# Patient Record
Sex: Female | Born: 1945
Health system: Southern US, Community
[De-identification: ages and names within clinical notes are randomized; demographics above are authoritative.]

## PROBLEM LIST (undated history)

## (undated) DIAGNOSIS — K635 Polyp of colon: Secondary | ICD-10-CM

## (undated) DIAGNOSIS — I10 Essential (primary) hypertension: Secondary | ICD-10-CM

## (undated) HISTORY — DX: Polyp of colon: K63.5

## (undated) HISTORY — PX: BUNIONECTOMY: SHX129

## (undated) HISTORY — DX: Essential (primary) hypertension: I10

---

## 1980-07-26 HISTORY — PX: ABDOMINAL HYSTERECTOMY: SHX81

## 1997-11-15 ENCOUNTER — Ambulatory Visit (HOSPITAL_COMMUNITY): Admission: RE | Admit: 1997-11-15 | Discharge: 1997-11-15 | Payer: Self-pay | Admitting: Internal Medicine

## 1997-11-20 ENCOUNTER — Ambulatory Visit (HOSPITAL_COMMUNITY): Admission: RE | Admit: 1997-11-20 | Discharge: 1997-11-20 | Payer: Self-pay | Admitting: Internal Medicine

## 2001-03-11 ENCOUNTER — Emergency Department (HOSPITAL_COMMUNITY): Admission: EM | Admit: 2001-03-11 | Discharge: 2001-03-11 | Payer: Self-pay | Admitting: *Deleted

## 2001-03-11 ENCOUNTER — Encounter: Payer: Self-pay | Admitting: *Deleted

## 2001-03-18 ENCOUNTER — Encounter: Payer: Self-pay | Admitting: Neurosurgery

## 2001-03-18 ENCOUNTER — Ambulatory Visit (HOSPITAL_COMMUNITY): Admission: RE | Admit: 2001-03-18 | Discharge: 2001-03-18 | Payer: Self-pay | Admitting: Neurosurgery

## 2002-05-14 ENCOUNTER — Other Ambulatory Visit: Admission: RE | Admit: 2002-05-14 | Discharge: 2002-05-14 | Payer: Self-pay | Admitting: Obstetrics and Gynecology

## 2002-06-06 ENCOUNTER — Encounter: Admission: RE | Admit: 2002-06-06 | Discharge: 2002-06-06 | Payer: Self-pay | Admitting: Internal Medicine

## 2002-06-06 ENCOUNTER — Encounter: Payer: Self-pay | Admitting: Internal Medicine

## 2002-06-15 ENCOUNTER — Encounter: Payer: Self-pay | Admitting: Internal Medicine

## 2002-06-15 ENCOUNTER — Encounter: Admission: RE | Admit: 2002-06-15 | Discharge: 2002-06-15 | Payer: Self-pay | Admitting: Internal Medicine

## 2003-05-17 ENCOUNTER — Other Ambulatory Visit: Admission: RE | Admit: 2003-05-17 | Discharge: 2003-05-17 | Payer: Self-pay | Admitting: Obstetrics and Gynecology

## 2003-05-22 ENCOUNTER — Encounter: Admission: RE | Admit: 2003-05-22 | Discharge: 2003-05-22 | Payer: Self-pay | Admitting: Internal Medicine

## 2003-05-31 ENCOUNTER — Ambulatory Visit (HOSPITAL_COMMUNITY): Admission: RE | Admit: 2003-05-31 | Discharge: 2003-05-31 | Payer: Self-pay | Admitting: Internal Medicine

## 2005-09-09 ENCOUNTER — Emergency Department (HOSPITAL_COMMUNITY): Admission: EM | Admit: 2005-09-09 | Discharge: 2005-09-09 | Payer: Self-pay | Admitting: Emergency Medicine

## 2007-12-25 ENCOUNTER — Inpatient Hospital Stay (HOSPITAL_COMMUNITY): Admission: EM | Admit: 2007-12-25 | Discharge: 2007-12-26 | Payer: Self-pay | Admitting: Emergency Medicine

## 2007-12-25 ENCOUNTER — Ambulatory Visit: Payer: Self-pay | Admitting: Internal Medicine

## 2007-12-26 ENCOUNTER — Encounter: Payer: Self-pay | Admitting: Infectious Disease

## 2007-12-26 ENCOUNTER — Ambulatory Visit: Payer: Self-pay | Admitting: Internal Medicine

## 2007-12-26 ENCOUNTER — Ambulatory Visit: Payer: Self-pay | Admitting: Vascular Surgery

## 2007-12-29 ENCOUNTER — Ambulatory Visit: Payer: Self-pay

## 2008-01-04 ENCOUNTER — Ambulatory Visit: Payer: Self-pay | Admitting: Internal Medicine

## 2008-01-04 DIAGNOSIS — E785 Hyperlipidemia, unspecified: Secondary | ICD-10-CM | POA: Insufficient documentation

## 2008-01-04 DIAGNOSIS — I10 Essential (primary) hypertension: Secondary | ICD-10-CM | POA: Insufficient documentation

## 2008-01-04 DIAGNOSIS — B029 Zoster without complications: Secondary | ICD-10-CM | POA: Insufficient documentation

## 2008-01-11 ENCOUNTER — Ambulatory Visit: Payer: Self-pay

## 2008-01-11 DIAGNOSIS — R079 Chest pain, unspecified: Secondary | ICD-10-CM | POA: Insufficient documentation

## 2008-02-10 ENCOUNTER — Emergency Department (HOSPITAL_COMMUNITY): Admission: EM | Admit: 2008-02-10 | Discharge: 2008-02-10 | Payer: Self-pay | Admitting: Emergency Medicine

## 2008-02-12 ENCOUNTER — Encounter: Payer: Self-pay | Admitting: Internal Medicine

## 2008-02-14 ENCOUNTER — Emergency Department (HOSPITAL_COMMUNITY): Admission: EM | Admit: 2008-02-14 | Discharge: 2008-02-14 | Payer: Self-pay | Admitting: Emergency Medicine

## 2008-02-22 ENCOUNTER — Ambulatory Visit: Payer: Self-pay | Admitting: Internal Medicine

## 2008-02-22 ENCOUNTER — Encounter: Payer: Self-pay | Admitting: Internal Medicine

## 2008-02-23 DIAGNOSIS — M79609 Pain in unspecified limb: Secondary | ICD-10-CM | POA: Insufficient documentation

## 2008-06-13 ENCOUNTER — Encounter: Admission: RE | Admit: 2008-06-13 | Discharge: 2008-06-13 | Payer: Self-pay | Admitting: Internal Medicine

## 2008-07-30 IMAGING — CT CT PELVIS W/O CM
2 of 7 series · 15 of 46 positions shown, 19 images · non-contrast
Comparison: Abdominal CT 09/09/2005.

CT ABDOMEN

CLINICAL DATA: Right flank pain.  Question ureteral calculus.

CT ABDOMEN AND PELVIS WITHOUT CONTRAST
TECHNIQUE: Multidetector CT imaging of the abdomen and pelvis was
performed following the standard
protocol without intravenous contrast.

[Series 2: stone_wo 5.0 b40f st · axial · 0.76mm/px · z∈[-266,+154]mm · 12 of 117 slices shown, 16 images]
[im 6/117  soft-tissue]
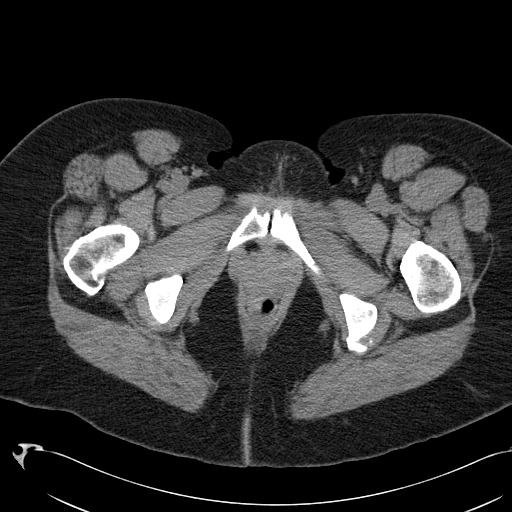
[im 6/117  bone]
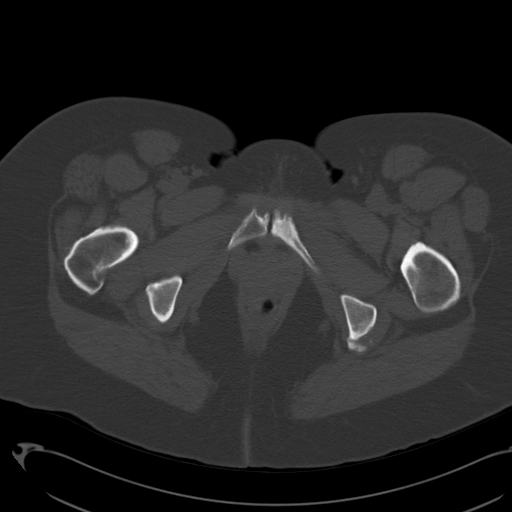
[im 18/117  soft-tissue]
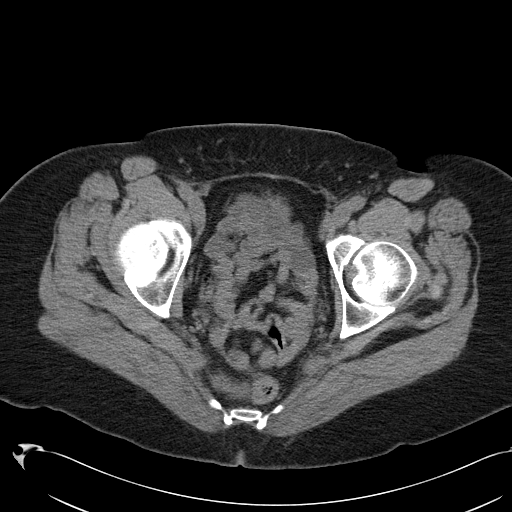
[im 30/117  soft-tissue]
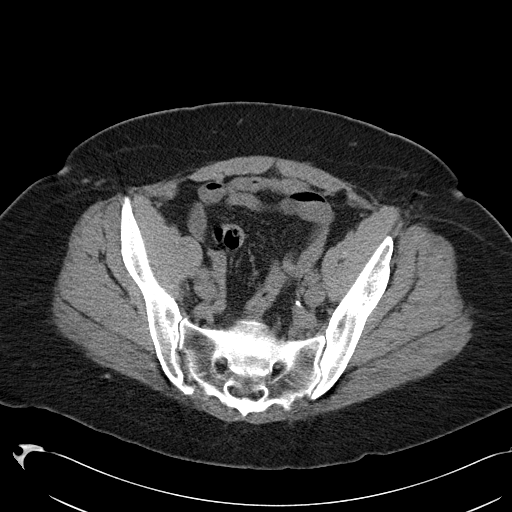
[im 41/117  soft-tissue]
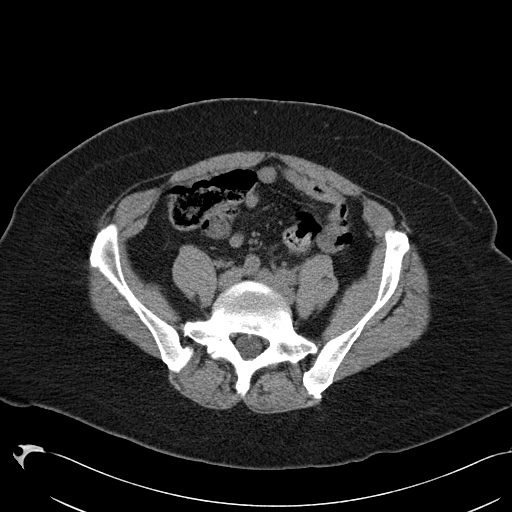
[im 53/117  soft-tissue]
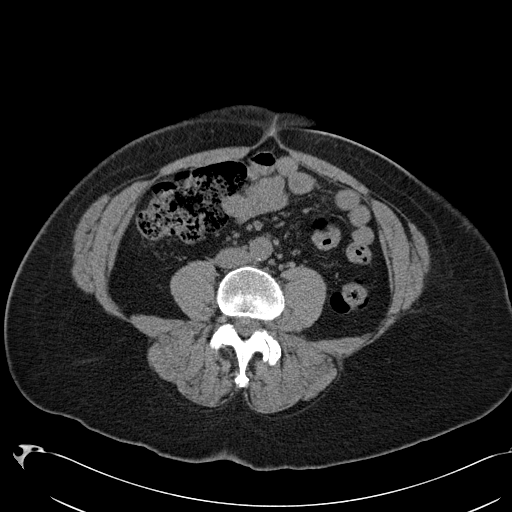
[im 64/117  soft-tissue]
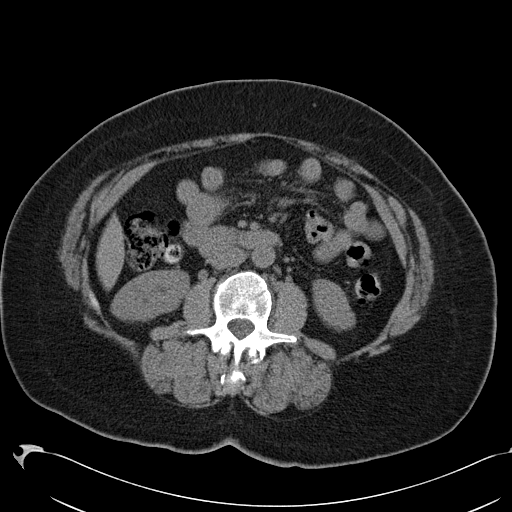
[im 76/117  soft-tissue]
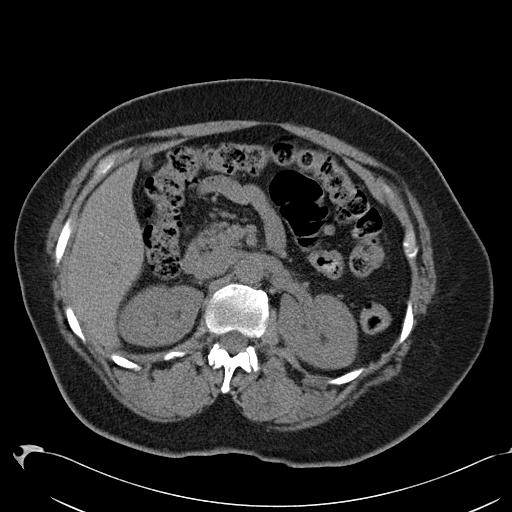
[im 88/117  soft-tissue]
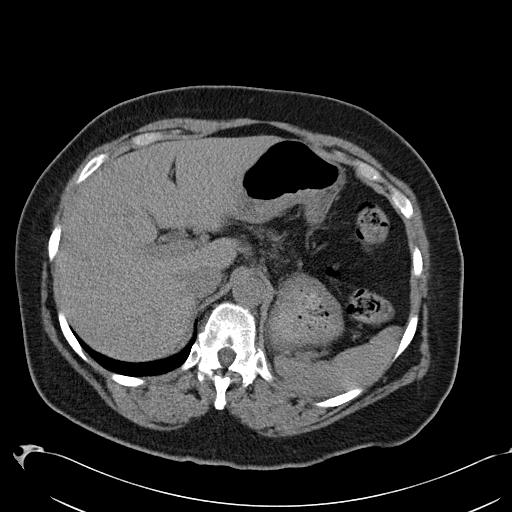
[im 93/117  lung]
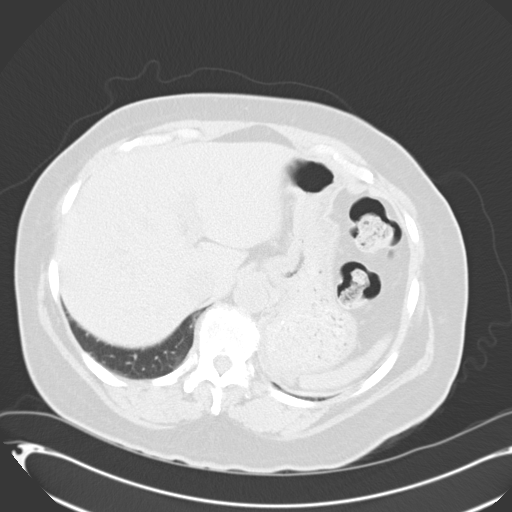
[im 99/117  soft-tissue]
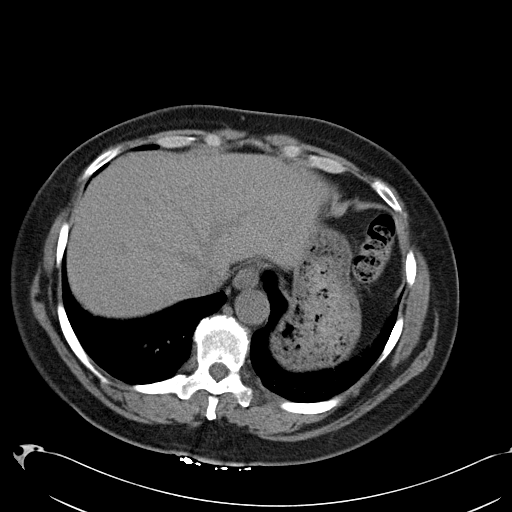
[im 99/117  lung]
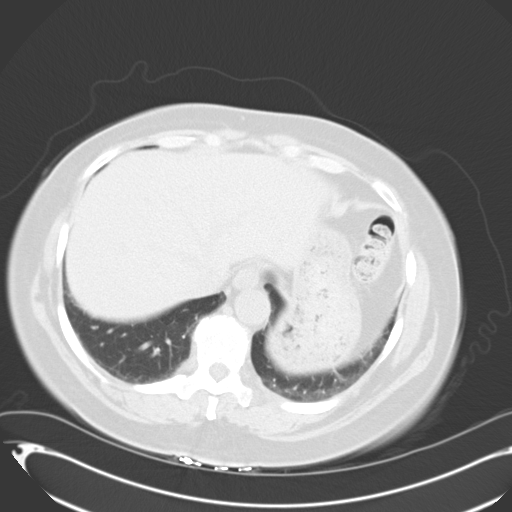
[im 99/117  bone]
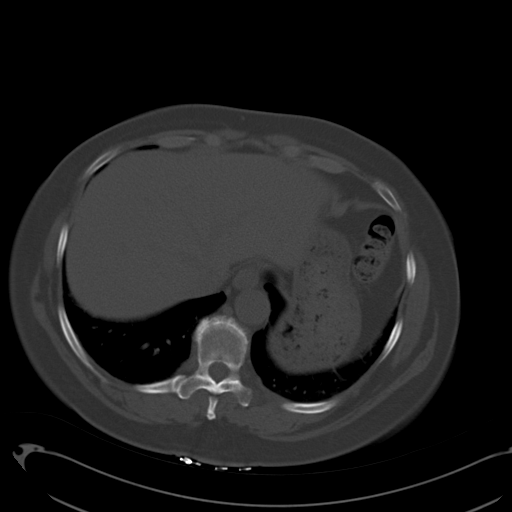
[im 105/117  lung]
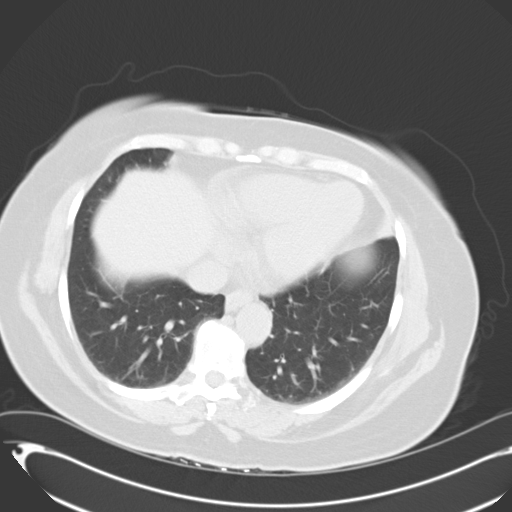
[im 111/117  soft-tissue]
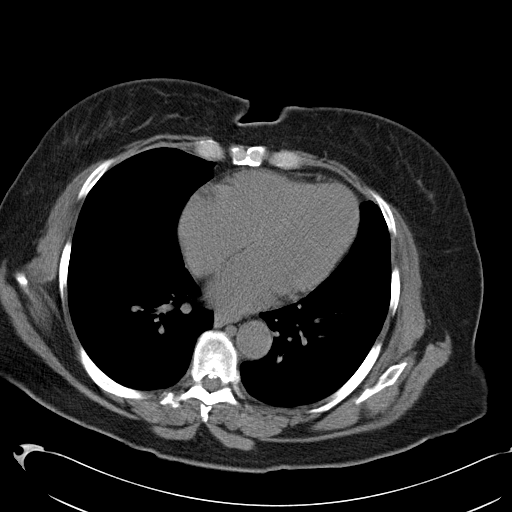
[im 111/117  lung]
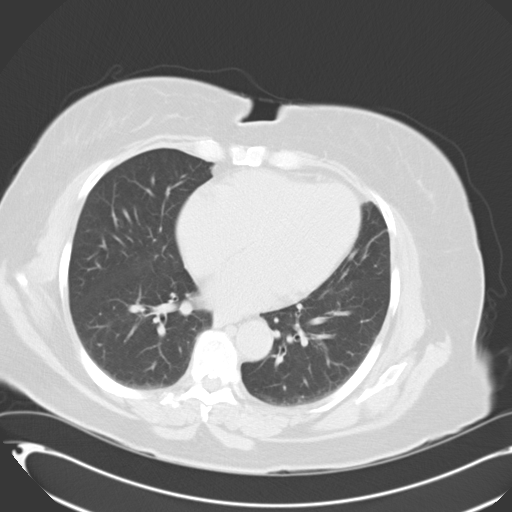

[Series 602: coronal · coronal · 0.95mm/px · 3 of 80 slices shown]
[im 16/80  soft-tissue]
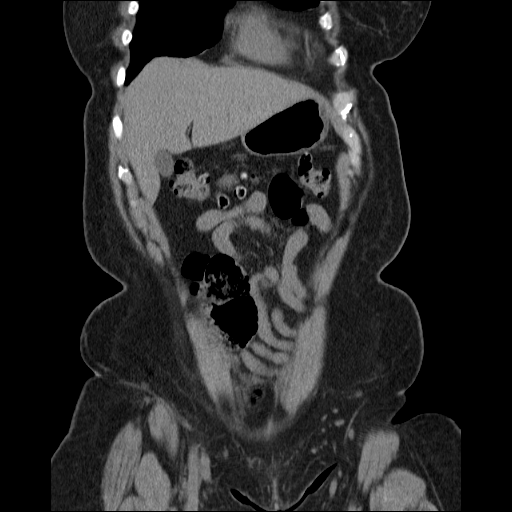
[im 32/80  soft-tissue]
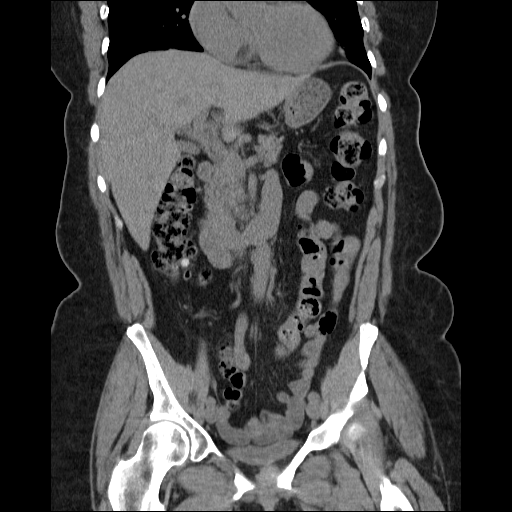
[im 48/80  soft-tissue]
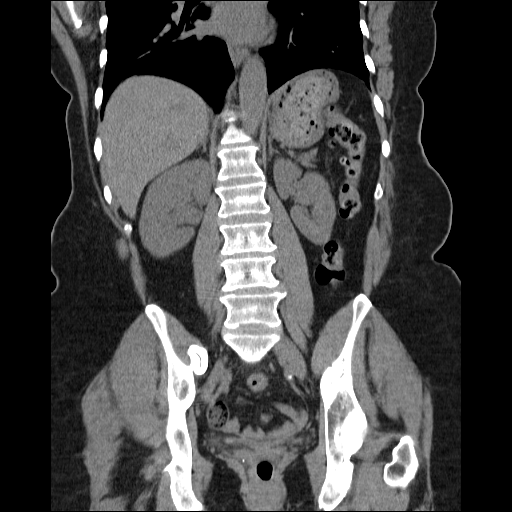

[15 of 46 positions shown; findings below may reference images not displayed]

FINDINGS: The lung bases are clear.  There is no pleural effusion.
There is a 2.8 cm cyst posteriorly in the right kidney on image 49
which is not significantly changed from the prior examination.  No
other renal masses are demonstrated.  There is no evidence of renal
or ureteral calculus.  There is no hydronephrosis or perinephric
soft tissue stranding.  A phlebolith anterior to the left psoas
muscle on image 72 is lateral to the ureter and unchanged from the
prior study.

As imaged in the noncontrast state, the liver, spleen, adrenal
glands, pancreas and gallbladder appear normal.  Diffuse colonic
diverticular changes are present without surrounding inflammatory
change.
IMPRESSION: 1.  No evidence of urinary tract calculus or hydronephrosis.
2.  Stable right renal cyst.
3.  No acute abdominal findings.

CT PELVIS
FINDINGS: Distally, the ureters are normal in caliber.  There is
no evidence of ureteral calculus.  No pelvic mass, fluid collection
or inflammatory process is seen.  Pelvic phleboliths appear stable.
IMPRESSION: 1.  Negative for urinary tract calculus or hydronephrosis.
2.  No acute pelvic findings demonstrated.

## 2008-08-20 ENCOUNTER — Ambulatory Visit: Payer: Self-pay | Admitting: Infectious Disease

## 2008-08-20 ENCOUNTER — Encounter: Payer: Self-pay | Admitting: Internal Medicine

## 2008-08-20 ENCOUNTER — Ambulatory Visit (HOSPITAL_COMMUNITY): Admission: RE | Admit: 2008-08-20 | Discharge: 2008-08-20 | Payer: Self-pay | Admitting: Internal Medicine

## 2008-08-20 DIAGNOSIS — M25519 Pain in unspecified shoulder: Secondary | ICD-10-CM | POA: Insufficient documentation

## 2008-09-09 ENCOUNTER — Ambulatory Visit: Payer: Self-pay | Admitting: Internal Medicine

## 2008-09-09 ENCOUNTER — Ambulatory Visit (HOSPITAL_COMMUNITY): Admission: RE | Admit: 2008-09-09 | Discharge: 2008-09-09 | Payer: Self-pay | Admitting: Internal Medicine

## 2009-04-26 ENCOUNTER — Encounter: Admission: RE | Admit: 2009-04-26 | Discharge: 2009-04-26 | Payer: Self-pay | Admitting: Internal Medicine

## 2009-08-08 ENCOUNTER — Encounter: Admission: RE | Admit: 2009-08-08 | Discharge: 2009-08-08 | Payer: Self-pay | Admitting: Internal Medicine

## 2010-05-17 ENCOUNTER — Emergency Department (HOSPITAL_COMMUNITY): Admission: EM | Admit: 2010-05-17 | Discharge: 2010-05-17 | Payer: Self-pay | Admitting: Emergency Medicine

## 2010-08-17 ENCOUNTER — Encounter: Payer: Self-pay | Admitting: Internal Medicine

## 2010-08-18 ENCOUNTER — Encounter
Admission: RE | Admit: 2010-08-18 | Discharge: 2010-08-18 | Payer: Self-pay | Source: Home / Self Care | Attending: Internal Medicine | Admitting: Internal Medicine

## 2010-08-27 NOTE — Assessment & Plan Note (Signed)
Summary: back pain, falling, wants to get back to work/pcp-alvarez/hla   Vital Signs:  Patient Profile:   65 Years Old Female Height:     66.5 inches (168.91 cm) Weight:      231.5 pounds (105.23 kg) BMI:     36.94 Temp:     97.1 degrees F (36.17 degrees C) oral Pulse rate:   100 / minute BP sitting:   146 / 97  (right arm)  Pt. in pain?   yes    Location:   right hip to knee    Intensity:   9  Vitals Entered By: Stanton Kidney Ditzler RN (February 22, 2008 2:09 PM)              Is Patient Diabetic? No Nutritional Status BMI of > 30 = obese Nutritional Status Detail appetite good  Have you ever been in a relationship where you felt threatened, hurt or afraid?denies   Does patient need assistance? Functional Status Self care Ambulation Impaired:Risk for fall Comments Uses a cane.     PCP:  Mariea Stable MD  Chief Complaint:  Pain getting worse on right hip to knee. Refill on pain med.Marland Kitchen  History of Present Illness: 65 yo woman with PMH of HTN, hyperlipidemia, anemia, right lower extremity pain (negative Dopplers) came in today for right leg pain that started about 3 weeks ago. She was working and while bending forward she felt a "pop"  in the right lower back area. After that, she started to get episodes of sharp pain, starting in her right hip (on the side) radiating down to her knee and sometimes down to her foot. Pain is worse with walking, or pushing heavy objects (which she does at work), and it is relieved by rest, crossing right leg over the left leg, and bending forward. She was seen here in ED about a week ago and was given cyclobenzaprine and ibuprfen. It helped with the pain but she ran out of the medicines. She denies leg weakness, numbness, tinggling, no difficulty walking, no swelling, or erythema, no fever, chills, no bladder or bowel incontinence, no other traumas to her right extremity.    Prior Medication List:   METOPROLOL TARTRATE 25 MG  TABS (METOPROLOL TARTRATE) take 1/2 tablet two times a day SIMVASTATIN 40 MG  TABS (SIMVASTATIN) Take 1 tab by mouth at bedtime ASPIRIN ADULT LOW STRENGTH 81 MG  TBEC (ASPIRIN) Take 1 tablet by mouth once a day * BLOOD BLEND SP-11A (DANDELION-YELLOW DOCK) - SILICA, CALCIUM Take 2 tablets by mouth 3  times a day   Updated Prior Medication List: METOPROLOL TARTRATE 25 MG  TABS (METOPROLOL TARTRATE) take 1/2 tablet two times a day SIMVASTATIN 40 MG  TABS (SIMVASTATIN) Take 1 tab by mouth at bedtime ASPIRIN ADULT LOW STRENGTH 81 MG  TBEC (ASPIRIN) Take 1 tablet by mouth once a day * BLOOD BLEND SP-11A (DANDELION-YELLOW DOCK) - SILICA, CALCIUM Take 2 tablets by mouth 3  times a day IBUPROFEN 600 MG  TABS (IBUPROFEN) Take 1 tablet by mouth three times a day  Current Allergies (reviewed today): No known allergies   Past Medical History:    Reviewed history from 01/04/2008 and no changes required:        1. Chest pain ruled out for myocardial infarction with negative             cardiac enzymes and serial EKGs.         2. Anemia (mild).  3. Hypertension.         4. Headaches with negative ESR.         5. Right lower extremity pain with negative Dopplers.         6. Hyperlipidemia with an LDL of 223.   Family History:    Reviewed history and no changes required:       No significant medical family history  Social History:    Reviewed history from 01/04/2008 and no changes required:       Former Smoker   Risk Factors:  Tobacco use:  quit    Year quit:  years ago Passive smoke exposure:  no Drug use:  no HIV high-risk behavior:  no Alcohol use:  yes    Type:  beer, wine    Comments:  drinks occasionally   Review of Systems  The patient denies fever, peripheral edema, muscle weakness, difficulty walking, and unusual weight change.     Physical Exam  General:     alert and cooperative to examination.     Knee Exam  Knee Exam:     Right:    Inspection:  Normal    Palpation:  Normal    Stability:  stable    Tenderness:  no    Swelling:  no    Erythema:  no    Left:    Inspection:  Normal    Palpation:  Normal    Stability:  stable    Tenderness:  no    Swelling:  no    Erythema:  no   Hip Exam  Sensory:    Gross coordination and sensation were normal.  Motor:    Motor strength 5/5 bilaterally for quadriceps, hamstrings, ankle dorsiflexion, ankle plantar flexion, .  Hip Exam:    Right:    Inspection:  Normal    Palpation:  Normal    Stability:  stable    Tenderness:  greater trochater    Swelling:  no    Erythema:  no    point tenderness over the greates trochater, radiating lateraly down her thigh to the knee    Left:    Inspection:  Normal    Palpation:  Normal    Stability:  stable    Tenderness:  no    Swelling:  no    Erythema:  no   Detailed Back/Spine Exam  Inspection:    No deformity, ecchymosis or swelling.   Lumbosacral Exam:  Inspection-deformity:    Normal Palpation-spinal tenderness:  Abnormal    Location:  L5-S1    point tenderness in lower lumbar region Range of Motion:    Right Lateral Bend:   20 degrees    Left Lateral Bend:   20 degrees Lying Straight Leg Raise:    Right:  positive at 40 degrees    Left:  negative Sitting Straight Leg Raise:    Right:  negative    Left:  negative Sciatic Notch:    There is no sciatic notch tenderness.    Impression & Recommendations:  Problem # 1:  LEG PAIN, RIGHT (ICD-729.5) Patients symptoms are likely due to herniated disk, probably job related injury. She had x-ray of her hip and knee done last week when she was in ED and they both showed no acute abnormalities, some degenerative spurring along upper right SI joint and slightly decreased L5-S1 disc space. Patient will continue taking:  Ibuprofen 600 mg three times per day  Cyclobenzaprine 5 mg three times per day as  needed for pain   Lidocaine 5% patch (12 hours on and 12 hours off)  I also wrote a note for the patient to take to her work so that they can assign her some light weight work while she is in pain. I will see her back in 64 month for follow up.  Problem # 2:  HYPERLIPIDEMIA (ICD-272.4) I was not able to find the the recent records of her FLP, so we will check it on her next appointment, we will also get her c-met checked since she has been on Simvastatin. She reports taking this med regularly.  Her updated medication list for this problem includes:    Simvastatin 40 Mg Tabs (Simvastatin) .Marland Kitchen... Take 1 tab by mouth at bedtime  Future Orders: T-Lipid Profile ([id number]) ... 03/22/2008   Problem # 3:  ESSENTIAL HYPERTENSION, BENIGN (ICD-401.1) BP is slightly above target range. Patient has missed few doses in the past 3 days. We will not make any changes to her regimen today but will continue to follow up. We will se her back in 1 month. I also advised her to check her BP at the drug store and to write it down so that we can review it on her next visit.  Her updated medication list for this problem includes:    Metoprolol Tartrate 25 Mg Tabs (Metoprolol tartrate) .Marland Kitchen... Take 1/2 tablet two times a day  BP today: 146/97 Prior BP: 126/82 (01/11/2008)   Complete Medication List: 1)  Metoprolol Tartrate 25 Mg Tabs (Metoprolol tartrate) .... Take 1/2 tablet two times a day 2)  Simvastatin 40 Mg Tabs (Simvastatin) .... Take 1 tab by mouth at bedtime 3)  Aspirin Adult Low Strength 81 Mg Tbec (Aspirin) .... Take 1 tablet by mouth once a day 4)  Blood Blend Sp-11a (dandelion-yellow Dock) - Silica, Calcium  .... Take 2 tablets by mouth 3  times a day 5)  Lidoderm 5 % Ptch (Lidocaine) .... Apply to the area, 12 hours on and 12 hours off 6)  Flexeril 5 Mg Tabs (Cyclobenzaprine hcl) .... Take 1 tablet by mouth three times a day as needed for pain  7)  Ibuprofen 600 Mg Tabs (Ibuprofen) .... Take 1 tablet by mouth three times a day   Patient Instructions: 1)  Please schedule a follow-up appointment in 6 weeks 2)  Avoid heavy duty exercise, lifting, pushing heavy objects. 3)  If pain gets worse come back sooner.   Prescriptions: IBUPROFEN 600 MG  TABS (IBUPROFEN) Take 1 tablet by mouth three times a day  #90 x 2   Entered and Authorized by:   Mliss Sax MD   Signed by:   Mliss Sax MD on 02/22/2008   Method used:   Print then Give to Patient   RxID:   [id number] FLEXERIL 5 MG  TABS (CYCLOBENZAPRINE HCL) Take 1 tablet by mouth three times a day  #90 x 2   Entered and Authorized by:   Mliss Sax MD   Signed by:   Mliss Sax MD on 02/22/2008   Method used:   Print then Give to Patient   RxID:   [id number] LIDODERM 5 %  PTCH (LIDOCAINE) apply to the area, 12 hours on and 12 hours off  #10 x 2   Entered and Authorized by:   Mliss Sax MD   Signed by:   Mliss Sax MD on 02/22/2008   Method used:   Print then Give to Patient   RxID:   [id number]  ]

## 2010-08-27 NOTE — Assessment & Plan Note (Signed)
Summary: [name]/FU/MEDS/CFB   Vital Signs:  Patient Profile:   65 Years Old Female Height:     66.5 inches (168.91 cm) Weight:      235.03 pounds (106.83 kg) BMI:     37.50 Temp:     97.3 degrees F (36.28 degrees C) oral Pulse rate:   108 / minute BP sitting:   143 / 90  (right arm) Cuff size:   large  Vitals Entered By: Angelina Ok RN (August 20, 2008 10:11 AM)             Is Patient Diabetic? No Nutritional Status BMI of > 30 = obese  Have you ever been in a relationship where you felt threatened, hurt or afraid?No   Does patient need assistance? Functional Status Self care Ambulation Normal     PCP:  Mariea Stable MD   History of Present Illness: Tammy [name] is a 65 yo woman with pMH as outlined in chart.  She is here today for her knee freezing up.  She states her right knee freezes up at night.  Has been going on for the last 3-4 months.  She states her knee gets ice cold to the touch, gets numb and also develops tingling feeling.  Knee also swells and gets tight.  However, there is no pain, locking of joint, redness, tenderness, fever, n/v.  Also has small bump on right shoulder that becomes painful on occasion, to where she cannot have her bra strap over it.  Furthermore, she states this limits her mobility.     Updated Prior Medication List: ASPIRIN ADULT LOW STRENGTH 81 MG  TBEC (ASPIRIN) Take 1 tablet by mouth once a day  Current Allergies (reviewed today): ! * IBUPROPEN  Past Medical History:     1. Chest pain ruled out for myocardial infarction with negative cardiac enzymes and serial EKGs.          Negative myoview with Dr. Gala Romney     2. Anemia (mild).      3. Hypertension.      4. Headaches with negative ESR.      5. Right lower extremity pain with negative Dopplers.      6. Hyperlipidemia with an LDL of 223.   Social History:    Former Smoker (quit 1969, insignificant amount:  <1 pack per week x 2-3 years).    Alcohol use-no     Drug use-no   Risk Factors:  Drug use:  no Alcohol use:  no   Review of Systems      See HPI   Physical Exam  General:     alert, cooperative to examination, and overweight-appearing.   Head:     normocephalic and atraumatic.   Eyes:     vision grossly intact, pupils equal, pupils round, and pupils reactive to light.  anicteric Neck:     supple and no carotid bruits.   Lungs:     normal respiratory effort, no accessory muscle use, normal breath sounds, no crackles, and no wheezes.   Heart:     normal rate, regular rhythm, no murmur, no gallop, and no rub.   Abdomen:     normal bowel sounds.   Msk:     small bony protuberance at Left AC joint, with ? small mobile cyst over it ? Limited abduction of left shoulder. Right knee with normal ROM, non tender, stable, no crepitations. Pulses:     palpable DP pulses bilaterally Extremities:     trace edema  bilaterally Neurologic:     alert & oriented X3, cranial nerves II-XII intact, strength normal in all extremities, sensation intact to light touch, and gait normal.   Psych:     Oriented X3, memory intact for recent and remote, normally interactive, good eye contact, not anxious appearing, and not depressed appearing.      Impression & Recommendations:  Problem # 1:  LEG PAIN, RIGHT (ICD-729.5) Right hip pain still present but much improved after seeing chiropracter and undergoing therapy.   However, now has symptoms of right knee described in HPI. Unclear etiology, however, unlikely to be PAD, neuologic (given distribution), has recent vascular studies.  Unlikely to be rheumatologic phenonmena such as cryoglobulinemia with such a presentation.  Will reassure for now.   If swelling returns, will have her come in to reassess and consider further work up  at that point.   Problem # 2:  HYPERLIPIDEMIA (ICD-272.4) Not on statin. Will start simvastatin 40 today (on previously).  Recheck lipids and LFTs in 3 months to reassess.  The following medications were removed from the medication list:    Simvastatin 40 Mg Tabs (Simvastatin) .Marland Kitchen... Take 1 tab by mouth at bedtime  Her updated medication list for this problem includes:    Simvastatin 40 Mg Tabs (Simvastatin) .Marland Kitchen... Take 1 tab by mouth at bedtime   Problem # 3:  ESSENTIAL HYPERTENSION, BENIGN (ICD-401.1) BP slightly elevated again. Not taking meds. Since her Celine Ahr is normal (will obtain report to confirm), will treat with HCTZ instead of metoprolol. Repeat BMET in a month or so.  The following medications were removed from the medication list:    Metoprolol Tartrate 25 Mg Tabs (Metoprolol tartrate) .Marland Kitchen... Take 1/2 tablet two times a day  Her updated medication list for this problem includes:    Hydrochlorothiazide 12.5 Mg Tabs (Hydrochlorothiazide) .Marland Kitchen... Take 1 tablet by mouth once a day  BP today: 143/90 Prior BP: 146/97 (02/22/2008)   Problem # 4:  SHOULDER PAIN, LEFT (ICD-719.41) Definitely has bony protuberance near [facility] joint.   L shoulder x ray to further assess.  The following medications were removed from the medication list:    Flexeril 5 Mg Tabs (Cyclobenzaprine hcl) .Marland Kitchen... Take 1 tablet by mouth three times a day as needed for pain    Ibuprofen 600 Mg Tabs (Ibuprofen) .Marland Kitchen... Take 1 tablet by mouth three times a day  Her updated medication list for this problem includes:    Aspirin Adult Low Strength 81 Mg Tbec (Aspirin) .Marland Kitchen... Take 1 tablet by mouth once a day  Orders: Diagnostic X-Ray/Fluoroscopy (Diagnostic X-Ray/Flu) Discussed shoulder exercises, use of moist heat or ice, and medication.   Complete Medication List: 1)  Aspirin Adult Low Strength 81 Mg Tbec (Aspirin) .... Take 1 tablet by mouth once a day 2)  Hydrochlorothiazide 12.5 Mg Tabs (Hydrochlorothiazide) .... Take 1 tablet by mouth once a day 3)  Simvastatin 40 Mg Tabs (Simvastatin) .... Take 1 tab by mouth at bedtime    Patient Instructions: 1)  Please schedule a follow-up appointment in 1 month for BP, and knee issues. 2)  Start HCTZ for blood pressure. 3)  I am not sure what the problem with your knee is, but it does not seem to be anything serious as discussed.  IF your knee swells up and symptoms change, call clinic to be seen. 4)  Will get x-ray of your shoulder and call you if there is anything abnormal. 5)  Start your cholesterol medicine, will need to  recheck labs in 3 months (cholesterol and liver function). 6)  If you have any problems before your next visit, call clinic.    Prescriptions: SIMVASTATIN 40 MG TABS (SIMVASTATIN) Take 1 tab by mouth at bedtime  #30 x 3   Entered and Authorized by:   Mariea Stable MD   Signed by:   Mariea Stable MD on 08/20/2008   Method used:   Print then Give to Patient   RxID:   [id number] HYDROCHLOROTHIAZIDE 12.5 MG TABS (HYDROCHLOROTHIAZIDE) Take 1 tablet by mouth once a day  #30 x 3   Entered and Authorized by:   Mariea Stable MD   Signed by:   Mariea Stable MD on 08/20/2008   Method used:   Print then Give to Patient   RxID:   [id number]    Vital Signs:  Patient Profile:   65 Years Old Female Height:     66.5 inches (168.91 cm) Weight:      235.03 pounds (106.83 kg) BMI:     37.50 Temp:     97.3 degrees F (36.28 degrees C) oral Pulse rate:   108 / minute BP sitting:   143 / 90 Cuff size:   large

## 2010-08-27 NOTE — Assessment & Plan Note (Signed)
Summary: HFU/ SB.   Vital Signs:  Patient Profile:   64 Years Old Female Height:     66.5 inches (168.91 cm) Weight:      240.0 pounds BMI:     38.29 Temp:     98.3 degrees F oral Pulse rate:   88 / minute BP sitting:   126 / 82  (right arm)  Pt. in pain?   yes    Location:   HEADACHES    Intensity:   7    Type:       aching  Vitals Entered ByFilomena Jungling NT II (January 11, 2008 2:56 PM)              Is Patient Diabetic? No Nutritional Status BMI of 25 - 29 = overweight  Have you ever been in a relationship where you felt threatened, hurt or afraid?No   Does patient need assistance? Functional Status Self care Ambulation Normal     PCP:  Mariea Stable MD  Chief Complaint:  HFU.  History of Present Illness: This is a    y/o woman with PMH of  1. Chest pain ruled out for myocardial infarction with negative       cardiac enzymes and serial EKGs.   2. Anemia (mild).   3. Hypertension.   4. Headaches with negative ESR.   5. Right lower extremity pain with negative Dopplers.   6. Hyperlipidemia with an LDL of 223. presenting for HFU for CP (she r/o for MI with neg. EKGs and CEs) and HTN f/u. She was seen last week for VZV infection and placed on acyclovir. She should have had a myoview w/ DR. Bensimhon the day b/f yesterday. She did not take the Acyclovir because too expensive. She uses a natural product for that (see med list). She tells me it makes her thirsty - has a lot of calcium in it from what I can see - I advise her not to take it if possible, or if takes it, to drink plenty of water.  The VZV mainly resolved. She has a HA starting last night. She thinks it might be her pillow.  No complaints otherwise. She brings me a short term disability form to fill out (but has BCBS) for the period that she was hospitalized. She had a hysterectomy, does not need PAP smears. She has regular mammograms. She goes to [place] on [address].     Prior Medications Reviewed Using: Medication Bottles  Updated Prior Medication List: METOPROLOL TARTRATE 25 MG  TABS (METOPROLOL TARTRATE) take 1/2 tablet two times a day SIMVASTATIN 40 MG  TABS (SIMVASTATIN) Take 1 tab by mouth at bedtime ASPIRIN ADULT LOW STRENGTH 81 MG  TBEC (ASPIRIN) Take 1 tablet by mouth once a day * BLOOD BLEND SP-11A (DANDELION-YELLOW DOCK) - SILICA, CALCIUM Take 2 tablets by mouth 3  times a day  Current Allergies: No known allergies   Past Surgical History:    Hysterectomy    Risk Factors: Tobacco use:  quit    Year quit:  years ago   Review of Systems       No F/C/N/V/D/C/CP/SOB/palpitations/leg swelling/dysuria.   Physical Exam  General:     alert and overweight-appearing.   Eyes:     vision grossly intact, pupils equal, pupils round, and pupils reactive to light.   Mouth:     pharynx pink and moist.   Lungs:     normal respiratory effort, no crackles, and no wheezes.   Heart:  normal rate, regular rhythm, no murmur, and no gallop.   Extremities:     +1 pedal edema, nonpitting (pt stood for 6 hrs today at work) Skin:     few crusted vesicles on abd. panniculus (L side )    Impression & Recommendations:  Problem # 1:  ESSENTIAL HYPERTENSION, BENIGN (ICD-401.1) She brings her BP log from home and she checks it two times a day. All the values are optimal!!! Her updated medication list for this problem includes:    Metoprolol Tartrate 25 Mg Tabs (Metoprolol tartrate) .Marland Kitchen... Take 1/2 tablet two times a day  BP today: 126/82 Prior BP: 134/88 (01/04/2008)  Orders: Est. Patient Level III ([address]109)   Problem # 2:  HERPES ZOSTER (ICD-053.9) Resolved, crusted over, no pain. Orders: Est. Patient Level III (60454)   Problem # 3:  CHEST PAIN (ICD-786.50) Resolved. Orders: Est. Patient Level III (09811)   Medications Added to Medication List This Visit:  1)  Blood Blend Sp-11a (dandelion-yellow Dock) - Silica, Calcium  .... Take 2 tablets by mouth 3  times a day  Other Orders: Future Orders: T-Lipid Profile ([id number]) ... 01/18/2008   Patient Instructions: 1)  Please schedule a follow-up appointment in 6 months for a regular check up and a cholesterol level.   ]

## 2010-08-27 NOTE — Letter (Signed)
Summary: *Referral Letter [facility])  [facility]  [address]   [place], [place] [address]   Phone: [phone]  Fax: [phone]    02/22/2008  Tammy [name] [address] [place], [place]  [address]  Phone: [phone]  To whom it may concern:    Tammy [name] was seen at [facility] today and based on my examination today I am concerned that there might be disk herniation in the lower back area which was probably caused by lifting and pushing heavy objects (> 10 pounds). I have following recommendations:  1) Patient should be off from work for additional week to allow time for healing 2) Patient is advised not to lift anything heavier then 10 lbs, also no pushing heavy object, do light work for 4 weeks to allow time for healing. If that is not possible she is advised to wear back brace for 4 weeks during her working hours. 3) Patient is also advised to wear Lidacaine patch on her back also for 4 weeks.  If you have any questions for me, please call me at [phone] at [facility].  Thank you  Please send (mail / fax) all correspondence pertaining to this referral to:  ATTN:    ______Dr. Sherlon Handing Magick________________   [facility]   [address]   [place], [place] [address]    Fax: [phone]  Sincerely,  Mliss Sax MD

## 2010-08-27 NOTE — Miscellaneous (Signed)
Summary: HIPAA Restrictions  HIPAA Restrictions   Imported By: Florinda Marker 08/20/2008 14:59:57  _____________________________________________________________________  External Attachment:    Type:   Image     Comment:   External Document
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
## 2010-08-27 NOTE — Consult Note (Signed)
Summary: [facility]  [facility]   Imported By: Florinda Marker 03/04/2008 14:55:02  _____________________________________________________________________  External Attachment:    Type:   Image     Comment:   External Document

## 2010-12-08 NOTE — Consult Note (Signed)
Tammy [name], [name]                ACCOUNT NO.:  [id number]   MEDICAL RECORD NO.:  [id number]           PATIENT TYPE:   LOCATION:                                 FACILITY:   PHYSICIAN:  Bevelyn Buckles. Bensimhon, MDDATE OF BIRTH:  1946-06-19   DATE OF CONSULTATION:  DATE OF DISCHARGE:                                 CONSULTATION   ADMITTING PHYSICIAN:  [facility] Teaching Service [facility], the patient  does not have a primary care physician.   CARDIOLOGIST:  Bevelyn Buckles. Bensimhon, MD.   SUMMARY OF HISTORY:  [name] is a 65 year old African American female  who was transported via EMS to [facility] for evaluation of  chest discomfort.  She stated that yesterday morning at approximately  7:30 a.m. while at work, she was moving a stack of Styrofoam trays and  she became very cold.  She turned off the [facility] vent and then suddenly  developed between her shoulder blades a pressure that pushed through to  her chest.  She felt that her left neck was swollen.  She states the  discomfort was a 9 on a scale of 0-10 and was so bad that made her gasp  for breath.  Every time she did take in a deep breath, it fell worse.  She does not recall if there was any change with movement as she tried  to remain very, very still.  She noted that when the worse part of the  episode had subsided, she was very tender to the touch in her chest  area.  She denied any nausea, vomiting, or associated diaphoresis or  prior occurrences.  Her coworker called EMS.  EMS report is not  available at the time of this dictation.  The patient recalls receiving  oxygen, IV, 4 baby aspirin, and sublingual nitroglycerin.  She states  that the sublingual nitroglycerin within 5 minutes helped the discomfort  going from a 9-1; however, despite this she continues to have chest  discomfort and she states it is still a 1 on a scale of 0-10.  The last  time it was a 0 just prior to onset yesterday.   PAST MEDICAL HISTORY:  No  known drug allergies.   MEDICATIONS PRIOR TO ADMISSION:  Aspirin 81 mg daily, unknown doses of  omega-3, garlic, vitamin B6 and 12, calcium, zinc, potassium, and  vitamin A, E, and D.  She also takes a herbal medication p.r.n. for  hypertension, but she does not know the ingredients.   She does have a history of hypertension.  She states that at home she  will check her blood pressure periodically and it has been in the 110s-  150s/70s-80s.  She also states that she had what she calls a TIA  approximately 10 years ago and has presented with speech problems.  She  has not seen a physician in many years.   Her surgical history is notable for a hysterectomy in 1982 and bilateral  bunion surgery.  She specifically denies diabetes, myocardial  infarction, COPD, renal dysfunction, thyroid disorder, bleeding,  dyscrasias, or she does  not know her cholesterol status.  She resides in  [place] alone.  She is divorced.  She has 2 sons, several  grandchildren, and one great grandchild.  She is a Location manager at  [employer].  She quit smoking approximately 30 years ago.  She states one  pack per last her approximately 5 days.  She smoked for 4 years.  She  denies any alcohol or drugs.  She does take herbal supplements as  described.  She does try to maintain a low-salt diet.  She does not  exercise.   FAMILY HISTORY:  Mother is 80 with a history of diabetes, end-stage  renal disease, and is on hemodialysis.  Her father died at the age of 87  from unknown reasons, possible history of Alzheimer's.  She has two  sisters and one brother, who are being treated for diabetes and  hypertension.   REVIEW OF SYSTEMS:  In addition to the above, notable for occasional  headache particularly in the evening that she thinks is associated with  high blood pressure, reading glasses despite not seeing a doctor.  She  does see a dentist regularly.  When she walks, she also notices some  right leg discomfort.   It does not necessarily reside with rest.  She  points to her hip in the side of her right lower leg.  She states that  this discomfort could occur any time, not necessarily just from walking.  She does describe occasional nocturia.  She is postmenopausal.  She does  have some arthralgias in her right hand.   PHYSICAL EXAM:  GENERAL:  Well-nourished, well-developed pleasant  African American female in no apparent distress.  Granddaughter and  great-granddaughter are present.  VITAL SIGNS:  T-max is 97.8, temperature 97.7, blood pressure 144/93,  pulse 81, and respirations 18.  I's and O's are unknown.  Telemetry  showed normal sinus rhythm without ectopy.  Admission weight was 109.4.  HEENT:  Unremarkable.  NECK:  Supple without thyromegaly, adenopathy, JVD, or carotid bruits.  CHEST:  Symmetrical excursion.  LUNGS:  Clear to auscultation without rales, rhonchi, or wheezing.  HEART:  PMI is not displaced.  Regular rate and rhythm.  Do not  appreciate murmurs, rubs, clicks, or gallops.  All pulses are  symmetrical and intact.  I do not appreciate any abdominal or femoral  bruits.  ABDOMEN:  Obese.  Bowel sounds present without organomegaly, masses, or  tenderness.  SKIN INTEGUMENT:  Intact.  EXTREMITIES:  Negative cyanosis, clubbing, or edema.  MUSCULOSKELETAL:  Grossly unremarkable although she does have diffuse  chest wall tenderness which makes her discomfort more noticeable.  She  does not have a change of her discomfort with full range of motion of  her upper extremities.  NEURO:  Unremarkable.   Chest x-ray showed no active disease.  EKG showed normal sinus rhythm,  baseline artifact normal intervals on admission.  Subsequent EKGs have  not shown any changes, borderline LVH.  H&H on admission was 11.2 and  33.2, normal indices.  Platelets 255,000 and WBCs 3.6.  Neutrophils were  33, and lymphocytes were elevated at 54.  Lytes done on admission show  sodium of 139, potassium  4.4, BUN 21, creatinine 1.1, and glucose 99.  TSH 0.852 and D-dimer 0.51.  CK-MBs relative indexes and troponins have  been unremarkable x3.  ESR is 11.  Fasting lipid showed a total  cholesterol of 286, triglycerides 83, HDL 46, and LDL 223.  This morning  H&H was  11.6 and 33.9.  WBCs again were low at 33.9 and platelets  252,000.  Sodium 141, potassium 4.0, BUN 13, creatinine 0.91, glucose  101, ferritin 72, and B12 893.   IMPRESSION:  1. Prolonged atypical chest discomfort is worse with palpation on      exam.  Enzymes and EKGs do not depict a cardiac event.  2. Hypertension that is not optimally controlled on an unknown herbal      supplementation at home on a p.r.n. basis.  Hyperlipidemia,      leukopenia with normal recheck count, history as noted above.   DISPOSITION:  Dr. Gala Romney has reviewed the patient's history, spoke  with and examined the patient, agrees with the above.  The patient has  ruled out for myocardial infarction with negative enzymes and EKGs.  Need optimal treatment for hypertension, consider Norvasc, recommend an  outpatient blood pressure diary and follow up  with her primary care physician which she needs a referral for.  We will  arrange a outpatient stress Myoview to evaluate her prolonged atypical  discomfort.  We would recommend follow up as well for her hyperlipidemia  and consider statin therapy if she will be compliant with follow up.      Joellyn Rued, PA-C      Bevelyn Buckles. Bensimhon, MD  Electronically Signed    EW/MEDQ  D:  12/26/2007  T:  12/27/2007  Job:  [id number]

## 2010-12-08 NOTE — Discharge Summary (Signed)
NAMEAYANA, IMHOF NO.:  [id number]   MEDICAL RECORD NO.:  [id number]          PATIENT TYPE:  INP   LOCATION:  4705                         FACILITY:  [facility]   PHYSICIAN:  Acey Lav, MD  DATE OF BIRTH:  21-Jul-1946   DATE OF ADMISSION:  12/25/2007  DATE OF DISCHARGE:  12/26/2007                               DISCHARGE SUMMARY   CHIEF COMPLAINT:  Chest pain.   DISCHARGE DIAGNOSES:  1. Chest pain ruled out for myocardial infarction with negative      cardiac enzymes and serial EKGs.  2. Anemia (mild).  3. Hypertension.  4. Headaches with negative ESR.  5. Right lower extremity pain with negative Dopplers.  6. Hyperlipidemia with an LDL of 223.   LIST OF MEDICATION AT DISCHARGE:  1. Metoprolol 12.5 mg 1 tablet p.o. b.i.d.  2. Simvastatin 40 mg 1 tablet p.o. daily.  3. Aspirin and 81 mg p.o. daily.  The patient will have an appointment with Dr. Elvera Lennox in the outpatient  clinic on January 11, 2008, at 3:15 p.m.  She will also have an appointment  for a stress Myoview on January 09, 2008, at 10 a.m.  The patient was  advised to bring all medications and blood pressure diary to the  appointment and this appointment was made with Dr. Gala Romney.  The  patient was advised to increase activity slowly and to follow a low-  sodium heart-healthy diet.   CONDITION ON DISCHARGE:  Improved.   PROCEDURE:  The patient had a chest x-ray that showed no acute changes  on admission. The patient also had bilateral lower extremity venous  Dopplers that were negative.   CONSULTATION:  [facility] with Dr. Gala Romney.   HISTORY OF PRESENT ILLNESS:  Mrs. Frees is a 65 year old African  American woman with no past medical history, but also with no primary  care, presenting to the ED with 1 episode of  15 minutes of substernal  pressure radiating to her neck and back in the morning of admission.  This was accompanied by shortness of breath and preceded by a cold chill  and sweats.  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  She turned to place one tray on the  other side of her body and at that time she felt the pressure in her  back radiating to her chest.  The pain resolved with nitroglycerin  sublingual, but recurred once in the ED.  This episode resolved by  itself.   SOCIAL HISTORY:  She is not a smoker.   FAMILY HISTORY:  She has no family history of heart disease.   PAST MEDICAL HISTORY:  She does not know her past medical history as she  did not see a physician in years.   She also mentions that she had heaviness in both legs for 2 days prior  to admission, but it resolved on the day of admission.  She has  occasional heartburn, but this pain felt different.   ALLERGIES:  She has no known drug allergies.  PHYSICAL EXAMINATION:  VITAL SIGNS:  Temperature 97.3, blood pressure  124/77, pulse 82, respiration rate 20, and oxygen saturation 99% on room  air.  GENERAL:  She was in no acute distress, alert, and oriented x3.  EYES:  PERRL.  Extraocular movements intact.  No pallor.  HEAD:  Pain on palpation of the left temporal artery territory.  ENT:  Moist mucous membranes.  NECK:  Supple.  No lymphadenopathy.  RESPIRATORY:  Clear to auscultation bilaterally.  Good air movement.  CARDIOVASCULAR:  Regular rate and rhythm.  No murmurs, rubs, or gallops.  CHEST:  No chest tenderness.  GI:  Soft.  Bowel sounds positive.  Nontender and nondistended.  EXTREMITIES:  Good pedal pulses bilaterally 2+.  No edema.  No redness.  No palpated cords, but pain in the right calf.  No warmness.  No edema.  NEUROLOGIC:  Alert and oriented x3.  Cranial nerves intact.  Moves all  4.  PSYCH:  Appropriate.   LABORATORY DATA:  Sodium 139, potassium 4.4, chloride 105, bicarb 23,  BUN 21, creatinine 1.1, and glucose 99.  White count 3.6, ANC 1.5,  hemoglobin 11.1, MCV 87.6, and platelets 255.  Point of care  markers  negative x2.  Regular cardiac enzymes negative x3, D-dimer 0.41, ESR was  normal at 11, and TSH was 0.85.  Fasting lipid profile: total  cholesterol 286, triglycerides 83, HDL 46, and LDL 223.  An EKG was  normal.   ASSESSMENT AND PLAN:  This is a 65 year old woman with no primary care,  who presents for an episode of chest pain.  1. Chest pain.  She has both typical and atypical features.  The      radiation on the left side of her chest is worrisome for angina.      Her risk factors are: age, hyperlipidemia and very remote history      of tobacco.  She ruled out for myocardial infarction with cardiac      enzymes x3, serial EKGs.  She also ruled out for pulmonary embolism      with a normal D-dimer.  She might have gastroesophageal reflux      disease, however, this pain is nonspecific for GERD since it      appeared suddenly, while she was moving traces from one side to the      other, and also the pain was not burning in quality.  The pain can      be musculoskeletal, even though her ESR is normal - a muscular      strain cannot be excluded.  We ruled out vertebral fracture - the      patient had a normal  T-spine x-ray and a CT of her cervical spine      showed only spondylytic changes.  Of note, the chest x-ray      basically ruled out pneumothorax and aortic dissection.  We started her on aspirin, Lopressor and high-dose Lipitor for plaque  stabilization.  She did not have any other episodes while in the  hospital, and she was discharged on the medication mentioned at the  beginning of this dictation.  She was seen by Dr. Gala Romney with [facility], who wanted to see the patient in outpatient for a Myoview.  1. Hypertension.  The patient was discharged on metoprolol.  2. Hyperlipidemia.  She was discharged on pravastatin.  3. Anemia.  This is mild between 11 and 12.  A B12 was 893 and a  ferritin was 72.  This should be followed in outpatient.  The       patient will have an appointment with me and at that time I will      repeat her blood pressure medication and I will reassess her for      chest pain.   LABS AT DISCHARGE:  Hemoglobin 11.6, white blood count 3.9, and  platelets 252.  Sodium 141, potassium 4.0, chloride 105, bicarb 29, BUN  13, creatinine 0.9, and glucose 101.  VITAL SIGNS:  Temperature 97.9, systolic blood pressure 137, diastolic  blood pressure 98, pulse 75, respiration rate 20, and oxygen saturation  98% on room air.      Carlus Pavlov, M.D.  Electronically Signed      Acey Lav, MD  Electronically Signed    CG/MEDQ  D:  01/01/2008  T:  01/02/2008  Job:  [id number]

## 2010-12-08 NOTE — Consult Note (Signed)
Tammy [name], Tammy [name]                ACCOUNT NO.:  [id number]   MEDICAL RECORD NO.:  [id number]           PATIENT TYPE:   LOCATION:                                 FACILITY:   PHYSICIAN:  Joellyn Rued, PA-C     DATE OF BIRTH:  02-Jun-1946   DATE OF CONSULTATION:  DATE OF DISCHARGE:                                 CONSULTATION   ADDENDUM   Her stress Myoview has been rescheduled for Friday, December 29, 2007, at 10  a.m.  She will have follow up with Dr. Gala Romney on the first available  appointment on January 21, 2008, at 4:30.      Joellyn Rued, PA-C     EW/MEDQ  D:  12/26/2007  T:  12/27/2007  Job:  [id number]

## 2011-04-22 LAB — RETICULOCYTES
RBC.: 4.05
Retic Count, Absolute: 40.5
Retic Ct Pct: 1

## 2011-04-22 LAB — LIPID PANEL
Cholesterol: 286 — ABNORMAL HIGH
HDL: 46
LDL Cholesterol: 223 — ABNORMAL HIGH
Total CHOL/HDL Ratio: 6.2
Triglycerides: 83
VLDL: 17

## 2011-04-22 LAB — CARDIAC PANEL(CRET KIN+CKTOT+MB+TROPI)
CK, MB: 2.7
CK, MB: 3.4
Relative Index: 1.2
Relative Index: 1.4
Total CK: 226 — ABNORMAL HIGH
Total CK: 246 — ABNORMAL HIGH
Troponin I: 0.01
Troponin I: 0.01

## 2011-04-22 LAB — CBC
HCT: 33.2 — ABNORMAL LOW
HCT: 33.9 — ABNORMAL LOW
Hemoglobin: 11.1 — ABNORMAL LOW
Hemoglobin: 11.6 — ABNORMAL LOW
MCHC: 33.5
MCHC: 34.3
MCV: 87.8
MCV: 88.1
Platelets: 252
Platelets: 255
RBC: 3.78 — ABNORMAL LOW
RBC: 3.84 — ABNORMAL LOW
RDW: 14.2
RDW: 14.6
WBC: 3.6 — ABNORMAL LOW
WBC: 3.9 — ABNORMAL LOW

## 2011-04-22 LAB — DIFFERENTIAL
Basophils Absolute: 0
Basophils Absolute: 0
Basophils Relative: 0
Basophils Relative: 1
Eosinophils Absolute: 0.1
Eosinophils Absolute: 0.1
Eosinophils Relative: 3
Eosinophils Relative: 3
Lymphocytes Relative: 49 — ABNORMAL HIGH
Lymphocytes Relative: 54 — ABNORMAL HIGH
Lymphs Abs: 1.8
Lymphs Abs: 2.1
Monocytes Absolute: 0.2
Monocytes Absolute: 0.4
Monocytes Relative: 10
Monocytes Relative: 7
Neutro Abs: 1.3 — ABNORMAL LOW
Neutro Abs: 1.5 — ABNORMAL LOW
Neutrophils Relative %: 33 — ABNORMAL LOW
Neutrophils Relative %: 41 — ABNORMAL LOW

## 2011-04-22 LAB — BASIC METABOLIC PANEL
BUN: 13
CO2: 29
Calcium: 9.5
Chloride: 105
Creatinine, Ser: 0.91
GFR calc Af Amer: >60
GFR calc non Af Amer: >60
Glucose, Bld: 101 — ABNORMAL HIGH
Potassium: 4
Sodium: 141

## 2011-04-22 LAB — POCT I-STAT, CHEM 8
BUN: 21
Calcium, Ion: 1.23
Chloride: 105
Creatinine, Ser: 1.1
Glucose, Bld: 99
HCT: 36
Hemoglobin: 12.2
Potassium: 4.4
Sodium: 139
TCO2: 27

## 2011-04-22 LAB — TSH: TSH: 0.852

## 2011-04-22 LAB — IRON AND TIBC
Iron: 66
Saturation Ratios: 20
TIBC: 328
UIBC: 262

## 2011-04-22 LAB — POCT CARDIAC MARKERS
CKMB, poc: 4
CKMB, poc: 4.3
Myoglobin, poc: 89.4
Myoglobin, poc: 98.2
Operator id: 284251
Operator id: 284251
Troponin i, poc: <0.05
Troponin i, poc: <0.05

## 2011-04-22 LAB — VITAMIN B12: Vitamin B-12: 893 (ref 211–911)

## 2011-04-22 LAB — D-DIMER, QUANTITATIVE: D-Dimer, Quant: 0.41

## 2011-04-22 LAB — CK TOTAL AND CKMB (NOT AT ARMC)
CK, MB: 4.5 — ABNORMAL HIGH
Relative Index: 1.5
Total CK: 298 — ABNORMAL HIGH

## 2011-04-22 LAB — TROPONIN I: Troponin I: <0.01

## 2011-04-22 LAB — FERRITIN: Ferritin: 72 (ref 10–291)

## 2011-04-22 LAB — SEDIMENTATION RATE: Sed Rate: 11

## 2011-04-22 LAB — FOLATE: Folate: >20

## 2011-04-23 LAB — COMPREHENSIVE METABOLIC PANEL
ALT: 19
AST: 23
Albumin: 3.7
Alkaline Phosphatase: 72
BUN: 9
CO2: 27
Calcium: 9.1
Chloride: 102
Creatinine, Ser: 0.86
GFR calc Af Amer: >60
GFR calc non Af Amer: >60
Glucose, Bld: 129 — ABNORMAL HIGH
Potassium: 3.9
Sodium: 137
Total Bilirubin: 0.7
Total Protein: 7.1

## 2011-04-23 LAB — CBC
HCT: 33.1 — ABNORMAL LOW
Hemoglobin: 11.3 — ABNORMAL LOW
MCHC: 34.2
MCV: 87.3
Platelets: 214
RBC: 3.79 — ABNORMAL LOW
RDW: 12.9
WBC: 4.6

## 2011-04-23 LAB — URINALYSIS, ROUTINE W REFLEX MICROSCOPIC
Bilirubin Urine: NEGATIVE
Glucose, UA: NEGATIVE
Hgb urine dipstick: NEGATIVE
Ketones, ur: NEGATIVE
Nitrite: NEGATIVE
Protein, ur: NEGATIVE
Specific Gravity, Urine: 1.026
Urobilinogen, UA: 0.2
pH: 7.5

## 2011-04-23 LAB — URINE MICROSCOPIC-ADD ON

## 2011-04-23 LAB — DIFFERENTIAL
Basophils Absolute: 0.1
Basophils Relative: 1
Eosinophils Absolute: 0
Eosinophils Relative: 0
Lymphocytes Relative: 14
Lymphs Abs: 0.6 — ABNORMAL LOW
Monocytes Absolute: 0.1
Monocytes Relative: 3
Neutro Abs: 3.8
Neutrophils Relative %: 82 — ABNORMAL HIGH

## 2011-10-11 ENCOUNTER — Other Ambulatory Visit: Payer: Self-pay | Admitting: Internal Medicine

## 2011-10-11 DIAGNOSIS — Z1231 Encounter for screening mammogram for malignant neoplasm of breast: Secondary | ICD-10-CM

## 2011-10-14 ENCOUNTER — Ambulatory Visit
Admission: RE | Admit: 2011-10-14 | Discharge: 2011-10-14 | Disposition: A | Discharge status: Home / Self Care | Payer: Medicare Other | Source: Ambulatory Visit | Attending: Internal Medicine | Admitting: Internal Medicine

## 2011-10-14 DIAGNOSIS — Z1231 Encounter for screening mammogram for malignant neoplasm of breast: Secondary | ICD-10-CM

## 2011-10-28 ENCOUNTER — Other Ambulatory Visit: Payer: Self-pay | Admitting: Internal Medicine

## 2011-10-28 DIAGNOSIS — R229 Localized swelling, mass and lump, unspecified: Secondary | ICD-10-CM

## 2011-11-01 ENCOUNTER — Other Ambulatory Visit: Payer: Medicare Other

## 2011-12-21 ENCOUNTER — Other Ambulatory Visit: Payer: Self-pay | Admitting: Neurosurgery

## 2011-12-21 DIAGNOSIS — M25512 Pain in left shoulder: Secondary | ICD-10-CM

## 2011-12-25 ENCOUNTER — Ambulatory Visit
Admission: RE | Admit: 2011-12-25 | Discharge: 2011-12-25 | Disposition: A | Discharge status: Home / Self Care | Payer: Medicare Other | Source: Ambulatory Visit | Attending: Neurosurgery | Admitting: Neurosurgery

## 2011-12-25 DIAGNOSIS — M25512 Pain in left shoulder: Secondary | ICD-10-CM

## 2012-01-13 ENCOUNTER — Other Ambulatory Visit: Payer: Self-pay | Admitting: Neurosurgery

## 2012-01-13 DIAGNOSIS — M542 Cervicalgia: Secondary | ICD-10-CM

## 2012-01-13 DIAGNOSIS — M545 Low back pain: Secondary | ICD-10-CM

## 2012-01-16 ENCOUNTER — Ambulatory Visit
Admission: RE | Admit: 2012-01-16 | Discharge: 2012-01-16 | Disposition: A | Discharge status: Home / Self Care | Payer: Medicare Other | Source: Ambulatory Visit | Attending: Neurosurgery | Admitting: Neurosurgery

## 2012-01-16 DIAGNOSIS — M545 Low back pain: Secondary | ICD-10-CM

## 2012-01-16 DIAGNOSIS — M542 Cervicalgia: Secondary | ICD-10-CM

## 2012-07-13 ENCOUNTER — Other Ambulatory Visit: Payer: Self-pay | Admitting: Internal Medicine

## 2012-07-13 DIAGNOSIS — I779 Disorder of arteries and arterioles, unspecified: Secondary | ICD-10-CM

## 2012-07-31 ENCOUNTER — Ambulatory Visit
Admission: RE | Admit: 2012-07-31 | Discharge: 2012-07-31 | Disposition: A | Discharge status: Home / Self Care | Payer: Medicare Other | Source: Ambulatory Visit | Attending: Internal Medicine | Admitting: Internal Medicine

## 2012-07-31 ENCOUNTER — Other Ambulatory Visit: Payer: Medicare Other

## 2012-07-31 DIAGNOSIS — I779 Disorder of arteries and arterioles, unspecified: Secondary | ICD-10-CM

## 2012-10-06 ENCOUNTER — Other Ambulatory Visit: Payer: Self-pay

## 2012-10-06 DIAGNOSIS — Z1231 Encounter for screening mammogram for malignant neoplasm of breast: Secondary | ICD-10-CM

## 2012-11-13 ENCOUNTER — Ambulatory Visit: Payer: Medicare Other

## 2012-11-21 ENCOUNTER — Ambulatory Visit
Admission: RE | Admit: 2012-11-21 | Discharge: 2012-11-21 | Disposition: A | Discharge status: Home / Self Care | Payer: Medicare Other | Source: Ambulatory Visit

## 2012-11-21 DIAGNOSIS — Z1231 Encounter for screening mammogram for malignant neoplasm of breast: Secondary | ICD-10-CM

## 2013-04-19 ENCOUNTER — Other Ambulatory Visit: Payer: Self-pay | Admitting: Internal Medicine

## 2013-04-19 DIAGNOSIS — N63 Unspecified lump in unspecified breast: Secondary | ICD-10-CM

## 2013-04-30 ENCOUNTER — Ambulatory Visit
Admission: RE | Admit: 2013-04-30 | Discharge: 2013-04-30 | Disposition: A | Discharge status: Home / Self Care | Payer: Commercial Managed Care - HMO | Source: Ambulatory Visit | Attending: Internal Medicine | Admitting: Internal Medicine

## 2013-04-30 DIAGNOSIS — N63 Unspecified lump in unspecified breast: Secondary | ICD-10-CM

## 2013-10-17 ENCOUNTER — Other Ambulatory Visit: Payer: Self-pay

## 2013-10-17 DIAGNOSIS — Z1231 Encounter for screening mammogram for malignant neoplasm of breast: Secondary | ICD-10-CM

## 2013-11-22 ENCOUNTER — Encounter (INDEPENDENT_AMBULATORY_CARE_PROVIDER_SITE_OTHER): Payer: Self-pay

## 2013-11-22 ENCOUNTER — Ambulatory Visit: Payer: Medicare Other

## 2013-11-22 ENCOUNTER — Ambulatory Visit
Admission: RE | Admit: 2013-11-22 | Discharge: 2013-11-22 | Disposition: A | Discharge status: Home / Self Care | Payer: Commercial Managed Care - HMO | Source: Ambulatory Visit

## 2013-11-22 DIAGNOSIS — Z1231 Encounter for screening mammogram for malignant neoplasm of breast: Secondary | ICD-10-CM

## 2014-08-22 DIAGNOSIS — E538 Deficiency of other specified B group vitamins: Secondary | ICD-10-CM | POA: Diagnosis not present

## 2014-08-22 DIAGNOSIS — E559 Vitamin D deficiency, unspecified: Secondary | ICD-10-CM | POA: Diagnosis not present

## 2014-08-22 DIAGNOSIS — Z0000 Encounter for general adult medical examination without abnormal findings: Secondary | ICD-10-CM | POA: Diagnosis not present

## 2014-08-22 DIAGNOSIS — M8580 Other specified disorders of bone density and structure, unspecified site: Secondary | ICD-10-CM | POA: Diagnosis not present

## 2014-08-22 DIAGNOSIS — Z1389 Encounter for screening for other disorder: Secondary | ICD-10-CM | POA: Diagnosis not present

## 2014-08-22 DIAGNOSIS — E780 Pure hypercholesterolemia: Secondary | ICD-10-CM | POA: Diagnosis not present

## 2014-08-22 DIAGNOSIS — I5020 Unspecified systolic (congestive) heart failure: Secondary | ICD-10-CM | POA: Diagnosis not present

## 2014-08-29 DIAGNOSIS — K219 Gastro-esophageal reflux disease without esophagitis: Secondary | ICD-10-CM | POA: Diagnosis not present

## 2014-08-29 DIAGNOSIS — I5032 Chronic diastolic (congestive) heart failure: Secondary | ICD-10-CM | POA: Diagnosis not present

## 2014-08-29 DIAGNOSIS — E785 Hyperlipidemia, unspecified: Secondary | ICD-10-CM | POA: Diagnosis not present

## 2014-08-29 DIAGNOSIS — E559 Vitamin D deficiency, unspecified: Secondary | ICD-10-CM | POA: Diagnosis not present

## 2014-08-29 DIAGNOSIS — E2839 Other primary ovarian failure: Secondary | ICD-10-CM | POA: Diagnosis not present

## 2014-09-17 DIAGNOSIS — Z86010 Personal history of colonic polyps: Secondary | ICD-10-CM | POA: Diagnosis not present

## 2014-09-17 DIAGNOSIS — K5900 Constipation, unspecified: Secondary | ICD-10-CM | POA: Diagnosis not present

## 2014-09-17 DIAGNOSIS — R635 Abnormal weight gain: Secondary | ICD-10-CM | POA: Diagnosis not present

## 2014-09-17 DIAGNOSIS — Z1211 Encounter for screening for malignant neoplasm of colon: Secondary | ICD-10-CM | POA: Diagnosis not present

## 2014-09-17 DIAGNOSIS — K5730 Diverticulosis of large intestine without perforation or abscess without bleeding: Secondary | ICD-10-CM | POA: Diagnosis not present

## 2014-10-28 ENCOUNTER — Other Ambulatory Visit: Payer: Self-pay

## 2014-10-28 DIAGNOSIS — Z1231 Encounter for screening mammogram for malignant neoplasm of breast: Secondary | ICD-10-CM

## 2014-10-28 DIAGNOSIS — G4733 Obstructive sleep apnea (adult) (pediatric): Secondary | ICD-10-CM | POA: Diagnosis not present

## 2014-10-28 DIAGNOSIS — Z1211 Encounter for screening for malignant neoplasm of colon: Secondary | ICD-10-CM | POA: Diagnosis not present

## 2014-11-13 ENCOUNTER — Encounter: Payer: Self-pay | Admitting: Family Medicine

## 2014-11-13 DIAGNOSIS — K5730 Diverticulosis of large intestine without perforation or abscess without bleeding: Secondary | ICD-10-CM | POA: Diagnosis not present

## 2014-11-13 DIAGNOSIS — Z1211 Encounter for screening for malignant neoplasm of colon: Secondary | ICD-10-CM | POA: Diagnosis not present

## 2014-11-26 ENCOUNTER — Encounter (INDEPENDENT_AMBULATORY_CARE_PROVIDER_SITE_OTHER): Payer: Self-pay

## 2014-11-26 ENCOUNTER — Ambulatory Visit
Admission: RE | Admit: 2014-11-26 | Discharge: 2014-11-26 | Disposition: A | Discharge status: Home / Self Care | Payer: Commercial Managed Care - HMO | Source: Ambulatory Visit

## 2014-11-26 DIAGNOSIS — Z1231 Encounter for screening mammogram for malignant neoplasm of breast: Secondary | ICD-10-CM | POA: Diagnosis not present

## 2015-02-25 DIAGNOSIS — H5213 Myopia, bilateral: Secondary | ICD-10-CM | POA: Diagnosis not present

## 2015-02-25 DIAGNOSIS — H5210 Myopia, unspecified eye: Secondary | ICD-10-CM | POA: Diagnosis not present

## 2015-05-13 DIAGNOSIS — H02839 Dermatochalasis of unspecified eye, unspecified eyelid: Secondary | ICD-10-CM | POA: Diagnosis not present

## 2015-05-13 DIAGNOSIS — H2512 Age-related nuclear cataract, left eye: Secondary | ICD-10-CM | POA: Diagnosis not present

## 2015-05-13 DIAGNOSIS — H2511 Age-related nuclear cataract, right eye: Secondary | ICD-10-CM | POA: Diagnosis not present

## 2015-05-13 DIAGNOSIS — H18411 Arcus senilis, right eye: Secondary | ICD-10-CM | POA: Diagnosis not present

## 2015-06-02 DIAGNOSIS — H2511 Age-related nuclear cataract, right eye: Secondary | ICD-10-CM | POA: Diagnosis not present

## 2015-06-02 DIAGNOSIS — H25811 Combined forms of age-related cataract, right eye: Secondary | ICD-10-CM | POA: Diagnosis not present

## 2015-06-03 DIAGNOSIS — H2512 Age-related nuclear cataract, left eye: Secondary | ICD-10-CM | POA: Diagnosis not present

## 2015-06-10 DIAGNOSIS — H2513 Age-related nuclear cataract, bilateral: Secondary | ICD-10-CM | POA: Diagnosis not present

## 2015-06-30 DIAGNOSIS — H25812 Combined forms of age-related cataract, left eye: Secondary | ICD-10-CM | POA: Diagnosis not present

## 2015-06-30 DIAGNOSIS — H2512 Age-related nuclear cataract, left eye: Secondary | ICD-10-CM | POA: Diagnosis not present

## 2015-07-08 DIAGNOSIS — H2513 Age-related nuclear cataract, bilateral: Secondary | ICD-10-CM | POA: Diagnosis not present

## 2015-09-02 ENCOUNTER — Telehealth: Payer: Self-pay | Admitting: Cardiology

## 2015-09-02 DIAGNOSIS — I509 Heart failure, unspecified: Secondary | ICD-10-CM | POA: Diagnosis not present

## 2015-09-02 DIAGNOSIS — E559 Vitamin D deficiency, unspecified: Secondary | ICD-10-CM | POA: Diagnosis not present

## 2015-09-02 DIAGNOSIS — I5032 Chronic diastolic (congestive) heart failure: Secondary | ICD-10-CM | POA: Diagnosis not present

## 2015-09-02 DIAGNOSIS — E785 Hyperlipidemia, unspecified: Secondary | ICD-10-CM | POA: Diagnosis not present

## 2015-09-02 DIAGNOSIS — D649 Anemia, unspecified: Secondary | ICD-10-CM | POA: Diagnosis not present

## 2015-09-02 NOTE — Telephone Encounter (Signed)
Received records from [facility] for appointment on 09/19/15 with Dr Antoine Poche.  Records given to [facility] (medical records) for Dr Hochrein's schedule on 09/19/15. lp

## 2015-09-09 DIAGNOSIS — R000 Tachycardia, unspecified: Secondary | ICD-10-CM | POA: Diagnosis not present

## 2015-09-09 DIAGNOSIS — Z23 Encounter for immunization: Secondary | ICD-10-CM | POA: Diagnosis not present

## 2015-09-09 DIAGNOSIS — I10 Essential (primary) hypertension: Secondary | ICD-10-CM | POA: Diagnosis not present

## 2015-09-09 DIAGNOSIS — I5032 Chronic diastolic (congestive) heart failure: Secondary | ICD-10-CM | POA: Diagnosis not present

## 2015-09-19 ENCOUNTER — Ambulatory Visit: Payer: Commercial Managed Care - HMO | Admitting: Cardiology

## 2015-11-24 ENCOUNTER — Other Ambulatory Visit: Payer: Self-pay

## 2015-11-24 DIAGNOSIS — Z1231 Encounter for screening mammogram for malignant neoplasm of breast: Secondary | ICD-10-CM

## 2015-12-30 DIAGNOSIS — F419 Anxiety disorder, unspecified: Secondary | ICD-10-CM | POA: Diagnosis not present

## 2016-01-07 ENCOUNTER — Other Ambulatory Visit: Payer: Self-pay | Admitting: Internal Medicine

## 2016-01-07 DIAGNOSIS — Z1231 Encounter for screening mammogram for malignant neoplasm of breast: Secondary | ICD-10-CM

## 2016-01-20 ENCOUNTER — Ambulatory Visit
Admission: RE | Admit: 2016-01-20 | Discharge: 2016-01-20 | Disposition: A | Discharge status: Home / Self Care | Payer: Commercial Managed Care - HMO | Source: Ambulatory Visit | Attending: Internal Medicine | Admitting: Internal Medicine

## 2016-01-20 DIAGNOSIS — Z1231 Encounter for screening mammogram for malignant neoplasm of breast: Secondary | ICD-10-CM | POA: Diagnosis not present

## 2016-03-02 ENCOUNTER — Ambulatory Visit
Admission: RE | Admit: 2016-03-02 | Discharge: 2016-03-02 | Disposition: A | Discharge status: Home / Self Care | Payer: Commercial Managed Care - HMO | Source: Ambulatory Visit | Attending: Internal Medicine | Admitting: Internal Medicine

## 2016-03-02 ENCOUNTER — Other Ambulatory Visit: Payer: Self-pay | Admitting: Internal Medicine

## 2016-03-02 DIAGNOSIS — R410 Disorientation, unspecified: Secondary | ICD-10-CM

## 2016-03-02 DIAGNOSIS — R202 Paresthesia of skin: Secondary | ICD-10-CM

## 2016-03-02 DIAGNOSIS — R51 Headache: Principal | ICD-10-CM

## 2016-03-02 DIAGNOSIS — R519 Headache, unspecified: Secondary | ICD-10-CM

## 2016-03-02 DIAGNOSIS — R200 Anesthesia of skin: Secondary | ICD-10-CM | POA: Diagnosis not present

## 2016-03-20 DIAGNOSIS — H2513 Age-related nuclear cataract, bilateral: Secondary | ICD-10-CM | POA: Diagnosis not present

## 2016-03-23 DIAGNOSIS — M25551 Pain in right hip: Secondary | ICD-10-CM | POA: Diagnosis not present

## 2016-03-23 DIAGNOSIS — M5136 Other intervertebral disc degeneration, lumbar region: Secondary | ICD-10-CM | POA: Diagnosis not present

## 2016-03-23 DIAGNOSIS — M5441 Lumbago with sciatica, right side: Secondary | ICD-10-CM | POA: Diagnosis not present

## 2016-03-23 DIAGNOSIS — M79651 Pain in right thigh: Secondary | ICD-10-CM | POA: Diagnosis not present

## 2016-04-06 DIAGNOSIS — M5430 Sciatica, unspecified side: Secondary | ICD-10-CM | POA: Diagnosis not present

## 2016-05-20 DIAGNOSIS — Z0000 Encounter for general adult medical examination without abnormal findings: Secondary | ICD-10-CM | POA: Diagnosis not present

## 2016-05-20 DIAGNOSIS — E785 Hyperlipidemia, unspecified: Secondary | ICD-10-CM | POA: Diagnosis not present

## 2016-07-01 DIAGNOSIS — Z23 Encounter for immunization: Secondary | ICD-10-CM | POA: Diagnosis not present

## 2016-08-17 DIAGNOSIS — N898 Other specified noninflammatory disorders of vagina: Secondary | ICD-10-CM | POA: Diagnosis not present

## 2016-08-17 DIAGNOSIS — R102 Pelvic and perineal pain: Secondary | ICD-10-CM | POA: Diagnosis not present

## 2016-10-04 ENCOUNTER — Ambulatory Visit: Payer: Self-pay

## 2016-10-13 ENCOUNTER — Ambulatory Visit: Payer: Self-pay | Admitting: Family Medicine

## 2016-10-20 ENCOUNTER — Ambulatory Visit (INDEPENDENT_AMBULATORY_CARE_PROVIDER_SITE_OTHER): Payer: Medicare HMO

## 2016-10-20 ENCOUNTER — Ambulatory Visit (INDEPENDENT_AMBULATORY_CARE_PROVIDER_SITE_OTHER): Payer: Medicare HMO | Admitting: Family Medicine

## 2016-10-20 ENCOUNTER — Encounter: Payer: Self-pay | Admitting: Family Medicine

## 2016-10-20 VITALS — BP 166/94 | HR 102 | Temp 98.1°F | Resp 16 | Ht 66.0 in | Wt 294.0 lb

## 2016-10-20 DIAGNOSIS — M67432 Ganglion, left wrist: Secondary | ICD-10-CM | POA: Diagnosis not present

## 2016-10-20 DIAGNOSIS — I517 Cardiomegaly: Secondary | ICD-10-CM | POA: Diagnosis not present

## 2016-10-20 DIAGNOSIS — I10 Essential (primary) hypertension: Secondary | ICD-10-CM | POA: Diagnosis not present

## 2016-10-20 DIAGNOSIS — Z1159 Encounter for screening for other viral diseases: Secondary | ICD-10-CM | POA: Diagnosis not present

## 2016-10-20 DIAGNOSIS — E785 Hyperlipidemia, unspecified: Secondary | ICD-10-CM | POA: Diagnosis not present

## 2016-10-20 DIAGNOSIS — Z789 Other specified health status: Secondary | ICD-10-CM | POA: Diagnosis not present

## 2016-10-20 MED ORDER — FUROSEMIDE 20 MG PO TABS: 20.0000 mg | ORAL_TABLET | Freq: Every day | ORAL | 3 refills | Status: DC

## 2016-10-20 MED ORDER — METOPROLOL TARTRATE 25 MG PO TABS: 25.0000 mg | ORAL_TABLET | Freq: Every day | ORAL | 3 refills | Status: DC

## 2016-10-20 MED ORDER — GABAPENTIN 100 MG PO CAPS: 100.0000 mg | ORAL_CAPSULE | Freq: Three times a day (TID) | ORAL | 3 refills | Status: DC

## 2016-10-20 MED ORDER — ASPIRIN EC 81 MG PO TBEC: 81.0000 mg | DELAYED_RELEASE_TABLET | Freq: Every day | ORAL | Status: DC

## 2016-10-20 NOTE — Patient Instructions (Addendum)
   IF you received an x-ray today, you will receive an invoice from [facility]. Please contact [facility] at [phone] with questions or concerns regarding your invoice.   IF you received labwork today, you will receive an invoice from [facility]. Please contact [facility] at [phone] with questions or concerns regarding your invoice.   Our billing staff will not be able to assist you with questions regarding bills from these companies.  You will be contacted with the lab results as soon as they are available. The fastest way to get your results is to activate your My Chart account. Instructions are located on the last page of this paperwork. If you have not heard from us regarding the results in 2 weeks, please contact this office.      Ganglion Cyst A ganglion cyst is a noncancerous, fluid-filled lump that occurs near joints or tendons. The ganglion cyst grows out of a joint or the lining of a tendon. It most often develops in the hand or wrist, but it can also develop in the shoulder, elbow, hip, knee, ankle, or foot. The round or oval ganglion cyst can be the size of a pea or larger than a grape. Increased activity may enlarge the size of the cyst because more fluid starts to build up. What are the causes? It is not known what causes a ganglion cyst to grow. However, it may be related to:  Inflammation or irritation around the joint.  An injury.  Repetitive movements or overuse.  Arthritis. What increases the risk? Risk factors include:  Being a woman.  Being age 20-50. What are the signs or symptoms? Symptoms may include:  A lump. This most often appears on the hand or wrist, but it can occur in other areas of the body.  Tingling.  Pain.  Numbness.  Muscle weakness.  Weak grip.  Less movement in a joint. How is this diagnosed? Ganglion cysts are most often diagnosed based on a physical exam. Your health care provider will feel the lump  and may shine a light alongside it. If it is a ganglion cyst, a light often shines through it. Your health care provider may order an X-ray, ultrasound, or MRI to rule out other conditions. How is this treated? Ganglion cysts usually go away on their own without treatment. If pain or other symptoms are involved, treatment may be needed. Treatment is also needed if the ganglion cyst limits your movement or if it gets infected. Treatment may include:  Wearing a brace or splint on your wrist or finger.  Taking anti-inflammatory medicine.  Draining fluid from the lump with a needle (aspiration).  Injecting a steroid into the joint.  Surgery to remove the ganglion cyst. Follow these instructions at home:  Do not press on the ganglion cyst, poke it with a needle, or hit it.  Take medicines only as directed by your health care provider.  Wear your brace or splint as directed by your health care provider.  Watch your ganglion cyst for any changes.  Keep all follow-up visits as directed by your health care provider. This is important. Contact a health care provider if:  Your ganglion cyst becomes larger or more painful.  You have increased redness, red streaks, or swelling.  You have pus coming from the lump.  You have weakness or numbness in the affected area.  You have a fever or chills. This information is not intended to replace advice given to you by your health care provider.   Make sure you discuss any questions you have with your health care provider. Document Released: 07/09/2000 Document Revised: 12/18/2015 Document Reviewed: 12/25/2013 Elsevier Interactive Patient Education  2017 Elsevier Inc.  

## 2016-10-20 NOTE — Progress Notes (Signed)
Chief Complaint  Patient presents with  . Establish Care  . Cyst    on left wrist, pain comes and goes/ pt wants to get it checked for cancer    HPI   Hypertension: Patient here for follow-up of elevated blood pressure. She is not exercising and is adherent to low salt diet.  Blood pressure is well controlled at home. Cardiac symptoms none. Patient denies chest pain, exertional chest pressure/discomfort, irregular heart beat, lower extremity edema, palpitations and paroxysmal nocturnal dyspnea.  Cardiovascular risk factors: advanced age (older than 7455 for men, 7565 for women), dyslipidemia, hypertension and obesity (BMI >= 30 kg/m2). Use of agents associated with hypertension: NSAIDS. History of target organ damage: none. 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 Hypertension     Current Outpatient Prescriptions  Medication Sig Dispense Refill  . furosemide (LASIX) 20 MG tablet Take 1 tablet (20 mg total) by mouth daily. 30 tablet 3  . gabapentin (NEURONTIN) 100 MG capsule Take 1 capsule (100 mg total) by mouth every 8 (eight) hours. 90 capsule 3  . metoprolol tartrate (LOPRESSOR) 25 MG tablet Take 1 tablet (25 mg total) by mouth daily. 30 tablet 3  . aspirin EC 81 MG  tablet Take 1 tablet (81 mg total) by mouth daily.     No current facility-administered medications for this visit.     Allergies: No Known Allergies  Past Surgical History:  Procedure Laterality Date  . ABDOMINAL HYSTERECTOMY    . BUNIONECTOMY      Social History   Social History  . Marital status: Divorced    Spouse name: N/A  . Number of children: N/A  . Years of education: N/A   Social History Main Topics  . Smoking status: Never Smoker  . Smokeless tobacco: Never Used  . Alcohol use No  . Drug use: No  . Sexual activity: Not Asked   Other Topics Concern  . None   Social History Narrative  . None    ROS  see hpi  Objective: Vitals:   10/20/16 0843  BP: (!) 166/94  Pulse: (!) 102  Resp: 16  Temp: 98.1 F (36.7 C)  TempSrc: Oral  SpO2: 99%  Weight: 294 lb (133.4 kg)  Height: 5\' 6"  (1.676 m)    Physical Exam  Constitutional: She is oriented to person, place, and time. She appears well-developed and well-nourished.  HENT:  Head: Normocephalic and atraumatic.  Nose: Nose normal.  Mouth/Throat: Oropharynx is clear and moist.  Eyes: Conjunctivae and EOM are normal.  Cardiovascular: Normal rate, regular rhythm and normal heart sounds.   No murmur heard. Pulmonary/Chest: Effort normal and breath sounds normal. No respiratory distress. She  has no wheezes. She has no rales.  Abdominal: Soft. Bowel sounds are normal. She exhibits no distension and no mass. There is no tenderness. There is no guarding.  Musculoskeletal:  Tender to palpation of the left ganglion cyst Pain with range of motion OA changes of both wrist Normal grip strength Palpable cyst on left wrist  Tender to palpation of the SI joint  Bilateral LE edema 1+  Neurological: She is alert and oriented to person, place, and time.  Skin: Skin is warm. Capillary refill takes less than 2 seconds.  Psychiatric: She has a normal mood and affect. Her behavior is normal. Judgment and thought  content normal.     CLINICAL DATA:  Left ganglion cyst of the wrist  EXAM: LEFT WRIST - COMPLETE 3+ VIEW  COMPARISON:  None.  FINDINGS: There is no evidence of fracture or dislocation. There is no evidence of arthropathy or other focal bone abnormality. Soft tissues are unremarkable.  IMPRESSION: Negative.   Electronically Signed   By: Charlett Nose M.D.   On: 10/20/2016 10:33  ECG shows NSR with LVH criteria   Assessment and Plan Tammy [name] was seen today for establish care and cyst.  Diagnoses and all orders for this visit:  Uncontrolled hypertension- blood tests for med monitoring Refilled bp meds today Pt to resume right away -     Comprehensive metabolic panel -     Lipid panel -     Microalbumin, urine -     EKG 12-Lead  Ganglion cyst of wrist, left- uncertain if ganglion cyst because of cxr but history and exam suggest ganglion cyst Referral placed for Orthopedic Surgery -     DG Wrist Complete Left -     Ambulatory referral to Orthopedic Surgery  Statin intolerance- pt not currently on statin  Dyslipidemia- not currently on statin, takes aspirin  Cardiomegaly- previously managed by Cardiology LVH on ECG -     EKG 12-Lead  Need for hepatitis C screening test -     HCV Ab w/Rflx to Verification  Other orders -     aspirin EC 81 MG tablet; Take 1 tablet (81 mg total) by mouth daily. -     furosemide (LASIX) 20 MG tablet; Take 1 tablet (20 mg total) by mouth daily. -     gabapentin (NEURONTIN) 100 MG capsule; Take 1 capsule (100 mg total) by mouth every 8 (eight) hours. -     metoprolol tartrate (LOPRESSOR) 25 MG tablet; Take 1 tablet (25 mg total) by mouth daily.     Send refills to mail order [facility] so pt can get 90 day supplies  Tammy [name]

## 2016-10-21 LAB — LIPID PANEL
Chol/HDL Ratio: 4.9 ratio units — ABNORMAL HIGH (ref 0.0–4.4)
Cholesterol, Total: 243 mg/dL — ABNORMAL HIGH (ref 100–199)
HDL: 50 mg/dL (ref >39)
LDL Calculated: 171 mg/dL — ABNORMAL HIGH (ref 0–99)
Triglycerides: 112 mg/dL (ref 0–149)
VLDL Cholesterol Cal: 22 mg/dL (ref 5–40)

## 2016-10-21 LAB — COMPREHENSIVE METABOLIC PANEL
ALT: 11 IU/L (ref 0–32)
AST: 18 IU/L (ref 0–40)
Albumin/Globulin Ratio: 1.2 (ref 1.2–2.2)
Albumin: 4.1 g/dL (ref 3.5–4.8)
Alkaline Phosphatase: 82 IU/L (ref 39–117)
BUN/Creatinine Ratio: 22 (ref 12–28)
BUN: 18 mg/dL (ref 8–27)
Bilirubin Total: 0.2 mg/dL (ref 0.0–1.2)
CO2: 24 mmol/L (ref 18–29)
Calcium: 9.8 mg/dL (ref 8.7–10.3)
Chloride: 103 mmol/L (ref 96–106)
Creatinine, Ser: 0.81 mg/dL (ref 0.57–1.00)
GFR calc Af Amer: 85 mL/min/{1.73_m2} (ref >59)
GFR calc non Af Amer: 74 mL/min/{1.73_m2} (ref >59)
Globulin, Total: 3.4 g/dL (ref 1.5–4.5)
Glucose: 104 mg/dL — ABNORMAL HIGH (ref 65–99)
Potassium: 4.7 mmol/L (ref 3.5–5.2)
Sodium: 141 mmol/L (ref 134–144)
Total Protein: 7.5 g/dL (ref 6.0–8.5)

## 2016-10-21 LAB — HCV INTERPRETATION

## 2016-10-21 LAB — HCV AB W/RFLX TO VERIFICATION: HCV Ab: <0.1 s/co ratio (ref 0.0–0.9)

## 2016-10-21 LAB — MICROALBUMIN, URINE: Microalbumin, Urine: 3.2 ug/mL

## 2016-10-24 ENCOUNTER — Encounter: Payer: Self-pay | Admitting: Family Medicine

## 2016-11-16 DIAGNOSIS — M67432 Ganglion, left wrist: Secondary | ICD-10-CM | POA: Diagnosis not present

## 2016-12-02 ENCOUNTER — Telehealth: Payer: Self-pay | Admitting: Family Medicine

## 2016-12-02 NOTE — Telephone Encounter (Signed)
[facility] on [address] is calling to get clarification on a medication.  Pt is wanting a 90 day supply of his Lasix tablets instead of 30 day supply.  Please advise [phone]

## 2016-12-03 MED ORDER — FUROSEMIDE 20 MG PO TABS
20.0000 mg | ORAL_TABLET | Freq: Every day | ORAL | 0 refills | Status: DC
Start: 2016-12-03 — End: 2017-01-15

## 2016-12-03 NOTE — Telephone Encounter (Signed)
10/12/16 last ov

## 2016-12-09 ENCOUNTER — Ambulatory Visit (INDEPENDENT_AMBULATORY_CARE_PROVIDER_SITE_OTHER): Payer: Medicare HMO

## 2016-12-09 ENCOUNTER — Ambulatory Visit (INDEPENDENT_AMBULATORY_CARE_PROVIDER_SITE_OTHER): Payer: Medicare HMO | Admitting: Physician Assistant

## 2016-12-09 ENCOUNTER — Encounter: Payer: Self-pay | Admitting: Physician Assistant

## 2016-12-09 VITALS — BP 139/82 | HR 98 | Temp 98.6°F | Resp 16 | Ht 66.5 in | Wt 250.2 lb

## 2016-12-09 DIAGNOSIS — S8992XA Unspecified injury of left lower leg, initial encounter: Secondary | ICD-10-CM | POA: Diagnosis not present

## 2016-12-09 DIAGNOSIS — M25562 Pain in left knee: Secondary | ICD-10-CM

## 2016-12-09 DIAGNOSIS — M79652 Pain in left thigh: Secondary | ICD-10-CM

## 2016-12-09 NOTE — Patient Instructions (Addendum)
Ice - you may apply ice to the affected area for no longer than 20 minutes every hour. Do not place ice directly to skin - use a towel as a barrier.  Rest and elevation - please keep your leg elevated several times a day to avoid swelling.  You can take Ibuprofen 615m and 500 mg of Tylenol at the same time for pain if your pain is not controlled with Ibuprofen alone.   Orthopedics will call you to schedule an appointment. Please go!   Thank you for coming in today. I hope you feel we met your needs.  Feel free to call [facility] if you have any questions or further requests.  Please consider signing up for MyChart if you do not already have it, as this is a great way to communicate with me.  Best,  Whitney McVey, PA-C  IF you received an x-ray today, [facility] an [facility]. Please contact [facility] at [phone] questions or concerns regarding your invoice.   IF you received labwork today, you will receive an invoice from [facility] Please contact [facility] at [phone] questions or concerns regarding your invoice.   Our billing staff will not be able to assist you with questions regarding bills from these companies.  You will be contacted with the lab results as soon as they are available. The fastest way to get your results is to activate your My Chart account. Instructions are located on the last page of this paperwork. If you have not heard from [facility] the results in 2 weeks, please contact this office.

## 2016-12-09 NOTE — Progress Notes (Signed)
Tammy AlbrightDoris M [name]  MRN: [id number] DOB: 08/18/1945  PCP: Doristine BosworthStallings, Zoe A, MD  Subjective:  Pt is a 71 year old female who presents to clinic for left leg pain. She was walking in her home this morning with her grandbaby walking behind her. Pt thought the baby was about to fall and quickly reached back to grab her. Pt believes her left knee extended backwards. She felt a pop with immediate pain to the back of her knee. She did not fall. Pain radiates up the back of her leg. No h/o knee injury. Pain is worse with bending her leg. Endorses reduced ROM due to pain. C/o swelling. She is walking with a cane.  Denies n/t, weakness, instability, pain with weight bearing.   Review of Systems  Musculoskeletal: Positive for gait problem and myalgias (back of knee and upper leg). Negative for arthralgias.  Skin: Negative.     Patient Active Problem List   Diagnosis Date Noted  . Statin intolerance 10/20/2016  . Cardiomegaly 10/20/2016  . SHOULDER PAIN, LEFT 08/20/2008  . Pain in limb 02/23/2008  . CHEST PAIN 01/11/2008  . HERPES ZOSTER 01/04/2008  . HYPERLIPIDEMIA 01/04/2008  . ESSENTIAL HYPERTENSION, BENIGN 01/04/2008    Current Outpatient Prescriptions on File Prior to Visit  Medication Sig Dispense Refill  . aspirin EC 81 MG tablet Take 1 tablet (81 mg total) by mouth daily.    . furosemide (LASIX) 20 MG tablet Take 1 tablet (20 mg total) by mouth daily. 90 tablet 0  . gabapentin (NEURONTIN) 100 MG capsule Take 1 capsule (100 mg total) by mouth every 8 (eight) hours. 90 capsule 3  . metoprolol tartrate (LOPRESSOR) 25 MG tablet Take 1 tablet (25 mg total) by mouth daily. 30 tablet 3   No current facility-administered medications on file prior to visit.     No Known Allergies   Objective:  BP 139/82 (BP Location: Right Arm, Patient Position: Sitting, Cuff Size: Large)   Pulse 98   Temp 98.6 F (37 C) (Oral)   Resp 16   Ht 5' 6.5" (1.689 m)   Wt 250 lb 3.2 oz (113.5 kg)   SpO2 97%    BMI 39.78 kg/m   Physical Exam  Constitutional: She is oriented to person, place, and time and well-developed, well-nourished, and in no distress. No distress.  Cardiovascular: Normal rate, regular rhythm and normal heart sounds.   Musculoskeletal:       Left knee: She exhibits no deformity, no erythema, normal alignment, normal patellar mobility, no bony tenderness and normal meniscus. No tenderness found.       Legs: Pain increases with flexion of left knee. Weakness with flexion against resistance.   Neurological: She is alert and oriented to person, place, and time. GCS score is 15.  Skin: Skin is warm and dry.  Psychiatric: Mood, memory, affect and judgment normal.  Vitals reviewed.  Dg Knee Complete 4 Views Left  Result Date: 12/09/2016 CLINICAL DATA:  LEFT knee pain.  Twisting injury EXAM: LEFT KNEE - COMPLETE 4+ VIEW COMPARISON:  None. FINDINGS: No fracture of the proximal tibia or distal femur. Patella is normal. No joint effusion. IMPRESSION: No fracture or dislocation. Electronically Signed   By: Genevive BiStewart  Edmunds M.D.   On: 12/09/2016 12:08    Assessment and Plan :  1. Acute pain of left knee 2. Pain of left thigh - DG Knee Complete 4 Views Left; Future - Ambulatory referral to Orthopedic Surgery - Negative x-ray. Concern for possible  hamstring rupture. Plan to send to orhto for work-up.  Pt refuses brace and crutches. Ace wrap applied. Encouraged ice and elevation. She plans to use her cane.   Marco Collie, PA-C  Primary Care at [facility] 12/09/2016 11:23 AM

## 2016-12-15 ENCOUNTER — Encounter: Payer: Self-pay | Admitting: Family Medicine

## 2016-12-15 ENCOUNTER — Telehealth: Payer: Self-pay | Admitting: Family Medicine

## 2016-12-15 ENCOUNTER — Ambulatory Visit (INDEPENDENT_AMBULATORY_CARE_PROVIDER_SITE_OTHER): Payer: Medicare HMO | Admitting: Family Medicine

## 2016-12-15 VITALS — BP 142/82 | HR 101 | Temp 98.3°F | Resp 16 | Ht 66.5 in | Wt 246.8 lb

## 2016-12-15 DIAGNOSIS — R1032 Left lower quadrant pain: Secondary | ICD-10-CM

## 2016-12-15 DIAGNOSIS — G8929 Other chronic pain: Secondary | ICD-10-CM

## 2016-12-15 DIAGNOSIS — R1031 Right lower quadrant pain: Secondary | ICD-10-CM

## 2016-12-15 DIAGNOSIS — K5730 Diverticulosis of large intestine without perforation or abscess without bleeding: Secondary | ICD-10-CM

## 2016-12-15 DIAGNOSIS — R109 Unspecified abdominal pain: Secondary | ICD-10-CM | POA: Diagnosis not present

## 2016-12-15 LAB — POCT CBC
Granulocyte percent: 42 %G (ref 37–80)
HCT, POC: 33.3 % — AB (ref 37.7–47.9)
Hemoglobin: 11.1 g/dL — AB (ref 12.2–16.2)
Lymph, poc: 2.8 (ref 0.6–3.4)
MCH, POC: 28.4 pg (ref 27–31.2)
MCHC: 33.5 g/dL (ref 31.8–35.4)
MCV: 84.8 fL (ref 80–97)
MID (cbc): 0.2 (ref 0–0.9)
MPV: 8.5 fL (ref 0–99.8)
POC Granulocyte: 2.2 (ref 2–6.9)
POC LYMPH PERCENT: 53.7 %L — AB (ref 10–50)
POC MID %: 4.3 %M (ref 0–12)
Platelet Count, POC: 265 10*3/uL (ref 142–424)
RBC: 3.93 M/uL — AB (ref 4.04–5.48)
RDW, POC: 13.6 %
WBC: 5.3 10*3/uL (ref 4.6–10.2)

## 2016-12-15 LAB — POCT URINALYSIS DIP (MANUAL ENTRY)
Bilirubin, UA: NEGATIVE
Blood, UA: NEGATIVE
Glucose, UA: NEGATIVE mg/dL
Ketones, POC UA: NEGATIVE mg/dL
Leukocytes, UA: NEGATIVE
Nitrite, UA: NEGATIVE
Protein Ur, POC: NEGATIVE mg/dL
Spec Grav, UA: 1.025 (ref 1.010–1.025)
Urobilinogen, UA: 0.2 E.U./dL
pH, UA: 5.5 (ref 5.0–8.0)

## 2016-12-15 NOTE — Progress Notes (Signed)
Chief Complaint  Patient presents with  . Flank Pain    LEFT SIDE INTO PELVIC AREA    HPI  Pt reports that she had a hysterectomy with removal of the uterus and the left ovary She reports that she gets a pain that feels like a stabbing that radiates to her vagina When the pain occurs she cannot even lift left leg She reports that this has been happening for several years and lead to her missing time from work She reports that she had a colonoscopy  In 2004 but did not know the results She denies blood per rectum Denies hematuria Denies dizziness  When she gets the episodes she usually stops eating and it resolves after a few days. She denies nausea or vomiting.  Past Medical History:  Diagnosis Date  . Hypertension     Current Outpatient Prescriptions  Medication Sig Dispense Refill  . aspirin EC 81 MG tablet Take 1 tablet (81 mg total) by mouth daily.    . furosemide (LASIX) 20 MG tablet Take 1 tablet (20 mg total) by mouth daily. 90 tablet 0  . metoprolol tartrate (LOPRESSOR) 25 MG tablet Take 1 tablet (25 mg total) by mouth daily. 30 tablet 3  . gabapentin (NEURONTIN) 100 MG capsule Take 1 capsule (100 mg total) by mouth every 8 (eight) hours. (Patient not taking: Reported on 12/15/2016) 90 capsule 3   No current facility-administered medications for this visit.     Allergies: No Known Allergies  Past Surgical History:  Procedure Laterality Date  . ABDOMINAL HYSTERECTOMY    . BUNIONECTOMY      Social History   Social History  . Marital status: Divorced    Spouse name: N/A  . Number of children: N/A  . Years of education: N/A   Social History Main Topics  . Smoking status: Never Smoker  . Smokeless tobacco: Never Used  . Alcohol use No  . Drug use: No  . Sexual activity: Not Asked   Other Topics Concern  . None   Social History Narrative  . None    ROS See hpi  Objective: Vitals:   12/15/16 1624 12/15/16 1630  BP: (!) 157/91 (!) 142/82    Pulse: (!) 101   Resp: 16   Temp: 98.3 F (36.8 C)   TempSrc: Oral   SpO2: 98%   Weight: 246 lb 12.8 oz (111.9 kg)   Height: 5' 6.5" (1.689 m)     Physical Exam  Constitutional: She is oriented to person, place, and time. She appears well-developed and well-nourished.  HENT:  Head: Normocephalic.  Eyes: Conjunctivae and EOM are normal.  Cardiovascular: Normal rate and regular rhythm.   Pulmonary/Chest: Effort normal and breath sounds normal. No respiratory distress. She has no wheezes.  Abdominal:    Neurological: She is alert and oriented to person, place, and time.   Colonoscopy 2004 Diverticulosis from the ascending to sigmoid colon 5 year follow up recommended   Assessment and Plan Tammy [name] was seen today for flank pain.  Diagnoses and all orders for this visit:  Left flank pain- neg UA -     POCT urinalysis dipstick -     POCT CBC  Diverticulosis of colon without hemorrhage- advised pt to follow up as her symptoms are suggestive of diverticular disease -     Ambulatory referral to Gastroenterology -     POCT CBC  Chronic bilateral lower abdominal pain- discussed that she should follow up with GI for rescreening Advised iron  rich foods but would not recommend iron supplements as this can aggravate abdominal pain -     POCT CBC  A total of 30 minutes were spent face-to-face with the patient during this encounter and over half of that time was spent on counseling and coordination of care.  Spent time counseling about diverticulosis    Tammy [name]

## 2016-12-15 NOTE — Telephone Encounter (Signed)
Unable to reach pt to notify her that she had a mild anemia and normal urine dip. She should eat iron rich foods and follow up with GI as discussed

## 2016-12-15 NOTE — Patient Instructions (Addendum)
We recommend that you schedule a mammogram for breast cancer screening. Typically, you do not need a referral to do this. Please contact a local imaging center to schedule your mammogram.  [facility] - [phone]  *ask for the Radiology Department [facility]) - [phone] or [phone]  [facility] - [phone] [facility] - [phone] [facility] - [phone]  *ask for the Radiology Department [facility] - [phone]  *ask for the Radiology Department [facility] - [phone]  *ask for the Mammography Department [facility] - [phone]     IF you received an x-ray today, you will receive an invoice from [facility]. Please contact [facility] at [phone] with questions or concerns regarding your invoice.   IF you received labwork today, you will receive an invoice from [facility]. Please contact [facility] at [phone] with questions or concerns regarding your invoice.   Our billing staff will not be able to assist you with questions regarding bills from these companies.  You will be contacted with the lab results as soon as they are available. The fastest way to get your results is to activate your My Chart account. Instructions are located on the last page of this paperwork. If you have not heard from us regarding the results in 2 weeks, please contact this office.      

## 2016-12-25 NOTE — Telephone Encounter (Signed)
Please check on gi refer status as no one has called her

## 2016-12-25 NOTE — Telephone Encounter (Signed)
Pt advised.

## 2016-12-25 NOTE — Telephone Encounter (Signed)
Sent to [facility] on 6/2

## 2017-01-13 ENCOUNTER — Other Ambulatory Visit: Payer: Self-pay | Admitting: Family Medicine

## 2017-01-13 ENCOUNTER — Other Ambulatory Visit: Payer: Self-pay | Admitting: Internal Medicine

## 2017-01-13 DIAGNOSIS — Z1239 Encounter for other screening for malignant neoplasm of breast: Secondary | ICD-10-CM

## 2017-01-15 ENCOUNTER — Encounter: Payer: Self-pay | Admitting: Family Medicine

## 2017-01-15 ENCOUNTER — Ambulatory Visit (INDEPENDENT_AMBULATORY_CARE_PROVIDER_SITE_OTHER): Payer: Medicare HMO | Admitting: Family Medicine

## 2017-01-15 VITALS — BP 139/80 | HR 108 | Temp 98.6°F | Resp 16 | Ht 66.0 in | Wt 245.6 lb

## 2017-01-15 DIAGNOSIS — N644 Mastodynia: Secondary | ICD-10-CM

## 2017-01-15 DIAGNOSIS — M25562 Pain in left knee: Secondary | ICD-10-CM

## 2017-01-15 DIAGNOSIS — R0683 Snoring: Secondary | ICD-10-CM | POA: Diagnosis not present

## 2017-01-15 DIAGNOSIS — G4710 Hypersomnia, unspecified: Secondary | ICD-10-CM

## 2017-01-15 MED ORDER — FUROSEMIDE 20 MG PO TABS: 20.0000 mg | ORAL_TABLET | Freq: Every day | ORAL | 1 refills | Status: DC

## 2017-01-15 MED ORDER — MELOXICAM 15 MG PO TABS: 15.0000 mg | ORAL_TABLET | Freq: Every day | ORAL | 1 refills | Status: DC

## 2017-01-15 MED ORDER — ASPIRIN EC 81 MG PO TBEC: 81.0000 mg | DELAYED_RELEASE_TABLET | Freq: Every day | ORAL | 3 refills | Status: AC

## 2017-01-15 MED ORDER — METOPROLOL TARTRATE 25 MG PO TABS: 25.0000 mg | ORAL_TABLET | Freq: Every day | ORAL | 1 refills | Status: DC

## 2017-01-15 NOTE — Progress Notes (Signed)
Chief Complaint  Patient presents with  . Mass    right side under arm and near breast, soreness   . Dizziness    HPI  Pt reports right breast pain along the bra line Pain has been present for 3 months She reports that she had some pain a year ago She went for her mammogram and was told she would need a referral No skin changes No nipple discharge No family history of breast cancer  Knee pain - left Pt reports that her left knee pain improved and when she was contacted by Orthopedic surgery she declined the office visit Now her left knee is beginning to hurt again She is taking OTC pain meds with minimal relief Her knee feels stiff at times She has difficulty with bending  Hypersomnolence Pt reports that she is snoring  She sleeps no matter where she is.  If she is sitting in church, watching tv, at a light in traffic or riding in the car as a passenger.  She states that it is concerning especially because of the driving risks She has never had a sleep study  Epworth sleepiness scale score 21  Past Medical History:  Diagnosis Date  . Hypertension     Current Outpatient Prescriptions  Medication Sig Dispense Refill  . aspirin EC 81 MG tablet Take 1 tablet (81 mg total) by mouth daily. 90 tablet 3  . furosemide (LASIX) 20 MG tablet Take 1 tablet (20 mg total) by mouth daily. 90 tablet 1  . metoprolol tartrate (LOPRESSOR) 25 MG tablet Take 1 tablet (25 mg total) by mouth daily. 90 tablet 1  . meloxicam (MOBIC) 15 MG tablet Take 1 tablet (15 mg total) by mouth daily. 30 tablet 1   No current facility-administered medications for this visit.     Allergies:  Allergies  Allergen Reactions  . Gabapentin Itching    Past Surgical History:  Procedure Laterality Date  . ABDOMINAL HYSTERECTOMY    . BUNIONECTOMY      Social History   Social History  . Marital status: Divorced    Spouse name: N/A  . Number of children: N/A  . Years of education: N/A   Social  History Main Topics  . Smoking status: Never Smoker  . Smokeless tobacco: Never Used  . Alcohol use No  . Drug use: No  . Sexual activity: Not Asked   Other Topics Concern  . None   Social History Narrative  . None    ROS See hpi  Objective: Vitals:   01/15/17 1122  BP: 139/80  Pulse: (!) 108  Resp: 16  Temp: 98.6 F (37 C)  TempSrc: Oral  SpO2: 99%  Weight: 245 lb 9.6 oz (111.4 kg)  Height: 5\' 6"  (1.676 m)    Physical Exam  Constitutional: She is oriented to person, place, and time. She appears well-developed and well-nourished.  HENT:  Head: Normocephalic and atraumatic.  Eyes: Conjunctivae and EOM are normal.  Neck: Normal range of motion.  Pulmonary/Chest: Effort normal.  Neurological: She is alert and oriented to person, place, and time.  Psychiatric: She has a normal mood and affect. Her behavior is normal. Judgment and thought content normal.     Breast exam Normal nipple Normal skin No palpable mass Area of tenderness on the right is at tail of spence Left breast nontender  Left knee with large knee joint No effusion palpable  Tenderness along the medial joint line Normal range of motion No laxity No crepitus  CLINICAL DATA:  LEFT knee pain.  Twisting injury  EXAM: LEFT KNEE - COMPLETE 4+ VIEW  COMPARISON:  None.  FINDINGS: No fracture of the proximal tibia or distal femur. Patella is normal. No joint effusion.  IMPRESSION: No fracture or dislocation.   Electronically Signed   By: Genevive Bi M.D.   On: 12/09/2016 12:08   Assessment and Plan Sacoya was seen today for mass and dizziness.  Diagnoses and all orders for this visit:  Hypersomnolence- discussed sleep testing -     Ambulatory referral to Sleep Studies  Snoring- discussed that she should not drive alone Or to avoid driving all together until she is tested -     Ambulatory referral to Sleep Studies  Breast pain, right- will refer for mammogram -      MM Digital Diagnostic Bilat; Future  Left knee pain- acute discussed orthopedics referral and that she should call  -     meloxicam (MOBIC) 15 MG tablet; Take 1 tablet (15 mg total) by mouth daily.  Med refills sent to [facility] -     furosemide (LASIX) 20 MG tablet; Take 1 tablet (20 mg total) by mouth daily. -     metoprolol tartrate (LOPRESSOR) 25 MG tablet; Take 1 tablet (25 mg total) by mouth daily. -     aspirin EC 81 MG tablet; Take 1 tablet (81 mg total) by mouth daily.     Hadia Minier A Jaiyden Laur

## 2017-01-15 NOTE — Patient Instructions (Addendum)
Call us at [phone] to make an appointment. For [facility]    IF you received an x-ray today, you will receive an invoice from [facility]. Please contact [facility] at [phone] with questions or concerns regarding your invoice.   IF you received labwork today, you will receive an invoice from [facility]. Please contact [facility] at [phone] with questions or concerns regarding your invoice.   Our billing staff will not be able to assist you with questions regarding bills from these companies.  You will be contacted with the lab results as soon as they are available. The fastest way to get your results is to activate your My Chart account. Instructions are located on the last page of this paperwork. If you have not heard from us regarding the results in 2 weeks, please contact this office.

## 2017-01-27 ENCOUNTER — Other Ambulatory Visit: Payer: Self-pay | Admitting: Family Medicine

## 2017-01-27 DIAGNOSIS — N644 Mastodynia: Secondary | ICD-10-CM

## 2017-02-01 ENCOUNTER — Ambulatory Visit (INDEPENDENT_AMBULATORY_CARE_PROVIDER_SITE_OTHER): Payer: Medicare HMO | Admitting: Internal Medicine

## 2017-02-01 ENCOUNTER — Other Ambulatory Visit (INDEPENDENT_AMBULATORY_CARE_PROVIDER_SITE_OTHER): Payer: Medicare HMO

## 2017-02-01 ENCOUNTER — Encounter: Payer: Self-pay | Admitting: Internal Medicine

## 2017-02-01 VITALS — BP 136/80 | HR 80 | Ht 66.25 in | Wt 246.4 lb

## 2017-02-01 DIAGNOSIS — R1030 Lower abdominal pain, unspecified: Secondary | ICD-10-CM

## 2017-02-01 DIAGNOSIS — Z1211 Encounter for screening for malignant neoplasm of colon: Secondary | ICD-10-CM

## 2017-02-01 LAB — BASIC METABOLIC PANEL
BUN: 15 mg/dL (ref 6–23)
CO2: 31 mEq/L (ref 19–32)
Calcium: 10.1 mg/dL (ref 8.4–10.5)
Chloride: 101 mEq/L (ref 96–112)
Creatinine, Ser: 0.75 mg/dL (ref 0.40–1.20)
GFR: 98.15 mL/min (ref >60.00)
Glucose, Bld: 102 mg/dL — ABNORMAL HIGH (ref 70–99)
Potassium: 4.2 mEq/L (ref 3.5–5.1)
Sodium: 138 mEq/L (ref 135–145)

## 2017-02-01 MED ORDER — NA SULFATE-K SULFATE-MG SULF 17.5-3.13-1.6 GM/177ML PO SOLN: 1.0000 | Freq: Once | ORAL | 0 refills | Status: AC

## 2017-02-01 NOTE — Patient Instructions (Addendum)
Your physician has requested that you go to the basement for the following lab work before leaving today:  BMET  You have been scheduled for a CT scan of the abdomen and pelvis at [facility] ([address]---this is in the same building as [facility]).   You are scheduled on 02/08/2017 at 10:30am. You should arrive 15 minutes prior to your appointment time for registration. Please follow the written instructions below on the day of your exam:  WARNING: IF YOU ARE ALLERGIC TO IODINE/X-RAY DYE, PLEASE NOTIFY RADIOLOGY IMMEDIATELY AT [phone]! YOU WILL BE GIVEN A 13 HOUR PREMEDICATION PREP.  1) Do not eat anything after 6:30am (4 hours prior to your test) 2) You have been given 2 bottles of oral contrast to drink. The solution may taste better if refrigerated, but do NOT add ice or any other liquid to this solution. Shake well before drinking.    Drink 1 bottle of contrast @ 8:30am (2 hours prior to your exam)  Drink 1 bottle of contrast @ 9:30am (1 hour prior to your exam)  You may take any medications as prescribed with a small amount of water except for the following: Metformin, Glucophage, Glucovance, Avandamet, Riomet, Fortamet, Actoplus Met, Janumet, Glumetza or Metaglip. The above medications must be held the day of the exam AND 48 hours after the exam.  The purpose of you drinking the oral contrast is to aid in the visualization of your intestinal tract. The contrast solution may cause some diarrhea. Before your exam is started, you will be given a small amount of fluid to drink. Depending on your individual set of symptoms, you may also receive an intravenous injection of x-ray contrast/dye. Plan on being at [facility] for 30 minutes or long, depending on the type of exam you are having performed.  If you have any questions regarding your exam or if you need to reschedule, you may call the CT department at [phone] between the hours of 8:00 am and 5:00  pm, Monday-Friday.  ________________________________________________________________________  Dennis Bast have been scheduled for a colonoscopy. Please follow written instructions given to you at your visit today.  Please pick up your prep supplies at the pharmacy within the next 1-3 days. If you use inhalers (even only as needed), please bring them with you on the day of your procedure. Your physician has requested that you go to www.startemmi.com and enter the access code given to you at your visit today. This web site gives a general overview about your procedure. However, you should still follow specific instructions given to you by our office regarding your preparation for the procedure.

## 2017-02-01 NOTE — Progress Notes (Signed)
HISTORY OF PRESENT ILLNESS:  Tammy [name] is a 71 y.o. female with hypertension who sent today by her primary care provider Dr. Creta Levin with chief complaint of chronic lower abdominal pain. Patient has not been seen in this office in many years. She did undergo colonoscopy in November 2004 and was found to have diverticulosis. Her current complaint is that of left lower quadrant discomfort which has been present for several years. The discomfort may settle around the navel. It may also be associated with discomfort radiating down her leg and left lower extremity numbness. She does have chronic back problems. Symptoms are exacerbated by walking. Symptoms are not affected by meals or bowel movements. She describes her bowel movements is regular. Discomfort may be present for an entire day. She may go as long as one week without discomfort. She has no weight loss or rectal bleeding. Upper GI review of systems is negative. She is status post hysterectomy with left ovary removal remotely. No family history of colon cancer. No recent imaging. CT scan of the abdomen in 2009 without contrast unrevealing. Most recent laboratories March 2018 revealed unremarkable comprehensive metabolic panel. CBC in May was unremarkable. Hemoglobin 11.1 which is unchanged over many years.  REVIEW OF SYSTEMS:  All non-GI ROS unless otherwise stated in the history of present illness negative except for sinus and allergy trouble, arthritis, back pain as stated, visual change, confusion, headaches, itching, muscle cramps, night sweats, skin rash, sleeping problems, ankle swelling, voice change  Past Medical History:  Diagnosis Date  . Colon polyps   . Hypertension     Past Surgical History:  Procedure Laterality Date  . ABDOMINAL HYSTERECTOMY  1982  . BUNIONECTOMY Bilateral     Social History Tammy [name]  reports that she quit smoking about 43 years ago. She quit after 3.00 years of use. She has never used smokeless  tobacco. She reports that she does not drink alcohol or use drugs.  family history includes Diabetes in her brother, mother, sister, and sister.  Allergies  Allergen Reactions  . Neurontin [Gabapentin] Itching       PHYSICAL EXAMINATION: Vital signs: BP 136/80 (BP Location: Left Arm, Patient Position: Sitting, Cuff Size: Large)   Pulse 80   Ht 5' 6.25" (1.683 m) Comment: height measured without shoes  Wt 246 lb 6 oz (111.8 kg)   BMI 39.47 kg/m   Constitutional: Pleasant, obese, generally well-appearing, no acute distress Psychiatric: alert and oriented x3, cooperative Eyes: extraocular movements intact, anicteric, conjunctiva pink Mouth: oral pharynx moist, no lesions Neck: supple without thyromegaly Lymph: no lymphadenopathy Cardiovascular: heart regular rate and rhythm, no murmur Lungs: clear to auscultation bilaterally Abdomen: soft, obese, nontender, nondistended, no obvious ascites, no peritoneal signs, normal bowel sounds, no organomegaly Rectal: Deferred until colonoscopy Extremities: no clubbing cyanosis or lower extremity edema bilaterally Skin: no lesions on visible extremities Neuro: No focal deficits. Cranial nerves intact  ASSESSMENT:  #1. Chronic left lower quadrant discomfort with features suggesting musculoskeletal. Possibly related to her back. Could be adhesions. Doubt urologic but should be ruled out. I do not think this is related to her diverticular disease, though this question was raised elsewhere #2. Diverticulosis on colonoscopy 2004. #3. Screening colonoscopy. Overdue   PLAN:  #1. Contrast-enhanced CT scan of the abdomen and pelvis to evaluate chronic pain #2. Schedule colonoscopy to provide colorectal neoplasia screening and evaluate pain.The nature of the procedure, as well as the risks, benefits, and alternatives were carefully and thoroughly reviewed with the  patient. Ample time for discussion and questions allowed. The patient understood, was  satisfied, and agreed to proceed. #3. If the above unrevealing then returned PCP evaluate for non-GI causes for pain as discussed above   A copy of this consultation note has been sent to Dr. Creta [name]

## 2017-02-02 ENCOUNTER — Ambulatory Visit
Admission: RE | Admit: 2017-02-02 | Discharge: 2017-02-02 | Disposition: A | Discharge status: Home / Self Care | Payer: Medicare HMO | Source: Ambulatory Visit | Attending: Family Medicine | Admitting: Family Medicine

## 2017-02-02 ENCOUNTER — Ambulatory Visit: Payer: Commercial Managed Care - HMO

## 2017-02-02 DIAGNOSIS — R928 Other abnormal and inconclusive findings on diagnostic imaging of breast: Secondary | ICD-10-CM | POA: Diagnosis not present

## 2017-02-02 DIAGNOSIS — N644 Mastodynia: Secondary | ICD-10-CM

## 2017-02-07 ENCOUNTER — Telehealth: Payer: Self-pay | Admitting: Internal Medicine

## 2017-02-07 NOTE — Telephone Encounter (Signed)
I explained to the patient that I mailed her a coupon to take to the pharmacy to help cover the price of the prep. She is aware that she should expect it in the mail this week.

## 2017-02-08 ENCOUNTER — Ambulatory Visit (INDEPENDENT_AMBULATORY_CARE_PROVIDER_SITE_OTHER)
Admission: RE | Admit: 2017-02-08 | Discharge: 2017-02-08 | Disposition: A | Discharge status: Home / Self Care | Payer: Medicare HMO | Source: Ambulatory Visit | Attending: Internal Medicine | Admitting: Internal Medicine

## 2017-02-08 DIAGNOSIS — R109 Unspecified abdominal pain: Secondary | ICD-10-CM | POA: Diagnosis not present

## 2017-02-08 DIAGNOSIS — R1030 Lower abdominal pain, unspecified: Secondary | ICD-10-CM | POA: Diagnosis not present

## 2017-02-08 DIAGNOSIS — Z1211 Encounter for screening for malignant neoplasm of colon: Secondary | ICD-10-CM

## 2017-02-08 MED ORDER — IOPAMIDOL (ISOVUE-300) INJECTION 61%: 100.0000 mL | Freq: Once | INTRAVENOUS | Status: DC | PRN

## 2017-02-22 ENCOUNTER — Institutional Professional Consult (permissible substitution): Payer: Medicare HMO | Admitting: Neurology

## 2017-03-14 ENCOUNTER — Telehealth: Payer: Self-pay | Admitting: Internal Medicine

## 2017-03-15 ENCOUNTER — Encounter: Payer: Self-pay | Admitting: Internal Medicine

## 2017-03-15 MED ORDER — NA SULFATE-K SULFATE-MG SULF 17.5-3.13-1.6 GM/177ML PO SOLN: 1.0000 | Freq: Once | ORAL | 0 refills | Status: AC

## 2017-03-15 NOTE — Telephone Encounter (Signed)
Sent prep to [facility].

## 2017-03-16 ENCOUNTER — Encounter: Payer: Self-pay | Admitting: Neurology

## 2017-03-16 ENCOUNTER — Ambulatory Visit (INDEPENDENT_AMBULATORY_CARE_PROVIDER_SITE_OTHER): Payer: Medicare HMO | Admitting: Neurology

## 2017-03-16 VITALS — BP 125/77 | HR 102 | Ht 66.0 in | Wt 241.0 lb

## 2017-03-16 DIAGNOSIS — G4719 Other hypersomnia: Secondary | ICD-10-CM

## 2017-03-16 DIAGNOSIS — G479 Sleep disorder, unspecified: Secondary | ICD-10-CM | POA: Diagnosis not present

## 2017-03-16 DIAGNOSIS — E669 Obesity, unspecified: Secondary | ICD-10-CM | POA: Diagnosis not present

## 2017-03-16 DIAGNOSIS — Z820 Family history of epilepsy and other diseases of the nervous system: Secondary | ICD-10-CM | POA: Diagnosis not present

## 2017-03-16 DIAGNOSIS — R51 Headache: Secondary | ICD-10-CM

## 2017-03-16 DIAGNOSIS — R0683 Snoring: Secondary | ICD-10-CM | POA: Diagnosis not present

## 2017-03-16 DIAGNOSIS — R519 Headache, unspecified: Secondary | ICD-10-CM

## 2017-03-16 DIAGNOSIS — R351 Nocturia: Secondary | ICD-10-CM | POA: Diagnosis not present

## 2017-03-16 NOTE — Progress Notes (Signed)
Subjective:    Patient ID: Tammy [name] is a 71 y.o. female.  HPI     Huston Foley, MD, PhD [facility] [address], [address] [address] [place], [place] [address]  Dear Dr. Creta Levin,   I saw your patient, Tammy [name], upon your kind request in my neurologic clinic today for initial consultation of her sleep disorder, in particular, concern for underlying obstructive sleep apnea. The patient is unaccompanied today. As you know, Tammy [name] is a 71 year old right-handed woman with an underlying medical history of left knee pain, hypertension and obesity, who reports snoring and excessive daytime somnolence. I reviewed your office note from 01/15/2017. Her Epworth sleepiness score is 17 out of 24, fatigue score is 39 out of 63. She is retired and lives alone. She is a nonsmoker and does not utilize alcohol and does not take illicit drugs, drinks caffeine in the form of coffee, one or 2 per day, likes tea, not daily. Symptoms have been ongoing for a few years. She has been told she snores, and has woken herself up with it, has woken up with a sense of gasping. She goes to the bathroom 3 to 4 times per night. She has woken up with a HA. She has been utilizing an OTC liq sleep aid for the past 3 weeks, which helps her stay asleep a little. She has had some RLS symptoms and wakes up from legs moving at times. She has a tendency to body rock at night. BT is between 9 and 10 PM. WT is at 6 AM currently. No Alarm, retired from being a Location manager. She has 2 grown sons. She keeps children at her house, in-home daycare. She does not nap. She has become sleepy at times while standing.She has a strong family history of obstructive sleep apnea and several of her siblings.  Her Past Medical History Is Significant For: Past Medical History:  Diagnosis Date  . Colon polyps   . Hypertension     Her Past Surgical History Is Significant For: Past Surgical History:  Procedure  Laterality Date  . ABDOMINAL HYSTERECTOMY  1982  . BUNIONECTOMY Bilateral     Her Family History Is Significant For: Family History  Problem Relation Age of Onset  . Diabetes Mother   . Diabetes Sister   . Diabetes Brother   . Diabetes Sister     Her Social History Is Significant For: Social History   Social History  . Marital status: Divorced    Spouse name: N/A  . Number of children: 2  . Years of education: N/A   Occupational History  . retired    Social History Main Topics  . Smoking status: Former Smoker    Years: 3.00    Quit date: 07/26/1973  . Smokeless tobacco: Never Used  . Alcohol use No  . Drug use: No  . Sexual activity: Not Asked   Other Topics Concern  . None   Social History Narrative  . None    Her Allergies Are:  Allergies  Allergen Reactions  . Neurontin [Gabapentin] Itching  :   Her Current Medications Are:  Outpatient Encounter Prescriptions as of 03/16/2017  Medication Sig  . aspirin EC 81 MG tablet Take 1 tablet (81 mg total) by mouth daily.  . Cholecalciferol (VITAMIN D3) 400 units CAPS Take by mouth.  . folic acid (FOLVITE) 800 MCG tablet Take 400 mcg by mouth daily.  . furosemide (LASIX) 20 MG tablet Take 1 tablet (  20 mg total) by mouth daily.  . metoprolol tartrate (LOPRESSOR) 25 MG tablet Take 1 tablet (25 mg total) by mouth daily.  . Multiple Vitamins-Minerals (ALIVE ONCE DAILY WOMENS PO) Take 1 tablet by mouth daily.  . Omega-3 1000 MG CAPS Take by mouth.  . vitamin E 400 UNIT capsule Take 400 Units by mouth daily.  . [DISCONTINUED] meloxicam (MOBIC) 15 MG tablet Take 1 tablet (15 mg total) by mouth daily. (Patient not taking: Reported on 03/16/2017)   No facility-administered encounter medications on file as of 03/16/2017.   :  Review of Systems:  Out of a complete 14 point review of systems, all are reviewed and negative with the exception of these symptoms as listed below: Review of Systems  Eyes:       Eye pain  left Blurred vision  Respiratory: Positive for shortness of breath.   Cardiovascular: Positive for leg swelling.  Endocrine: Positive for cold intolerance and heat intolerance.  Musculoskeletal: Positive for joint swelling.       Joint pain   Neurological: Positive for weakness.       Weakness sleepiness Confusion Restless legs    Objective:  Neurological Exam  Physical Exam Physical Examination:   Vitals:   03/16/17 1549  BP: 125/77  Pulse: (!) 102    General Examination: The patient is a very pleasant 71 y.o. female in no acute distress. She appears well-developed and well-nourished and well groomed.   HEENT: Normocephalic, atraumatic, pupils are equal, round and reactive to light and accommodation. s/p cataract extractions. Extraocular tracking is good without limitation to gaze excursion or nystagmus noted. Normal smooth pursuit is noted. Hearing is grossly intact. Face is symmetric with normal facial animation and normal facial sensation. Speech is clear with no dysarthria noted. There is no hypophonia. There is no lip, neck/head, jaw or voice tremor. Neck is supple with full range of passive and active motion. There are no carotid bruits on auscultation. Oropharynx exam reveals: mild mouth dryness, adequate dental hygiene and partial dentures upper and lower with moderate airway crowding, due to wider tongue, smaller airway and tonsils in place. Mallampati is class II. Tongue protrudes centrally and palate elevates symmetrically. Tonsils are 1+ in size. Neck size is 15.5 inches. She has a minimal overbite.  Chest: Clear to auscultation without wheezing, rhonchi or crackles noted.  Heart: S1+S2+0, regular and normal without murmurs, rubs or gallops noted.   Abdomen: Soft, non-tender and non-distended with normal bowel sounds appreciated on auscultation.  Extremities: There is trace pitting edema in the L distal lower extremity.  Skin: Warm and dry without trophic changes  noted. There are no varicose veins.  Musculoskeletal: exam reveals no obvious joint deformities, tenderness or joint swelling or erythema.   Neurologically:  Mental status: The patient is awake, alert and oriented in all 4 spheres. Her immediate and remote memory, attention, language skills and fund of knowledge are appropriate. There is no evidence of aphasia, agnosia, apraxia or anomia. Speech is clear with normal prosody and enunciation. Thought process is linear. Mood is normal and affect is normal.  Cranial nerves II - XII are as described above under HEENT exam. In addition: shoulder shrug is normal with equal shoulder height noted. Motor exam: Normal bulk, strength and tone is noted. There is no drift, tremor or rebound. Romberg is negative. Reflexes are 2+ throughout. Fine motor skills and coordination: intact with normal finger taps, normal hand movements, normal rapid alternating patting, normal foot taps and normal foot  agility.  Cerebellar testing: No dysmetria or intention tremor on finger to nose testing. Heel to shin is unremarkable bilaterally. There is no truncal or gait ataxia.  Sensory exam: intact to light touch in the upper and lower extremities.  Gait, station and balance: She stands easily. No veering to one side is noted. No leaning to one side is noted. Posture is age-appropriate and stance is narrow based. Gait shows normal stride length and normal pace. No problems turning are noted. Tandem walk is unremarkable.   Assessment and Plan:  In summary, Tammy [name] is a very pleasant 71 y.o.-year old female with an underlying medical history of left knee pain, hypertension and obesity, whose history and physical exam are concerning for obstructive sleep apnea (OSA). I had a long chat with the patient about my findings and the diagnosis of OSA, its prognosis and treatment options. We talked about medical treatments, surgical interventions and non-pharmacological approaches. I  explained in particular the risks and ramifications of untreated moderate to severe OSA, especially with respect to developing cardiovascular disease down the Road, including congestive heart failure, difficult to treat hypertension, cardiac arrhythmias, or stroke. Even type 2 diabetes has, in part, been linked to untreated OSA. Symptoms of untreated OSA include daytime sleepiness, memory problems, mood irritability and mood disorder such as depression and anxiety, lack of energy, as well as recurrent headaches, especially morning headaches. We talked about trying to maintain a healthy lifestyle in general, as well as the importance of weight control. I encouraged the patient to eat healthy, exercise daily and keep well hydrated, to keep a scheduled bedtime and wake time routine, to not skip any meals and eat healthy snacks in between meals. I advised the patient not to drive when feeling sleepy. I recommended the following at this time: sleep study with potential positive airway pressure titration. (We will score hypopneas at 4%).   I explained the sleep test procedure to the patient and also outlined possible surgical and non-surgical treatment options of OSA, including the use of a custom-made dental device (which would require a referral to a specialist dentist or oral surgeon), upper airway surgical options, such as pillar implants, radiofrequency surgery, tongue base surgery, and UPPP (which would involve a referral to an ENT surgeon). Rarely, jaw surgery such as mandibular advancement may be considered.  I also explained the CPAP treatment option to the patient, who indicated that she would be willing to try CPAP if the need arises. I explained the importance of being compliant with PAP treatment, not only for insurance purposes but primarily to improve Her symptoms, and for the patient's long term health benefit, including to reduce Her cardiovascular risks. I answered all her questions today and the  patient was in agreement. I would like to see her back after the sleep study is completed and encouraged her to call with any interim questions, concerns, problems or updates.   Thank you very much for allowing me to participate in the care of this nice patient. If I can be of any further assistance to you please do not hesitate to call me at [phone].  Sincerely,   Huston Foley, MD, PhD

## 2017-03-16 NOTE — Patient Instructions (Addendum)
Based on your symptoms and your exam I believe you are at risk for obstructive sleep apnea or OSA, and I think we should proceed with a sleep study to determine whether you do or do not have OSA and how severe it is. If you have more than mild OSA, I want you to consider treatment with CPAP. Please remember, the risks and ramifications of moderate to severe obstructive sleep apnea or OSA are: Cardiovascular disease, including congestive heart failure, stroke, difficult to control hypertension, arrhythmias, and even type 2 diabetes has been linked to untreated OSA. Sleep apnea causes disruption of sleep and sleep deprivation in most cases, which, in turn, can cause recurrent headaches, problems with memory, mood, concentration, focus, and vigilance. Most people with untreated sleep apnea report excessive daytime sleepiness, which can affect their ability to drive. Please do not drive if you feel sleepy.   I will likely see you back after your sleep study to go over the test results and where to go from there. We will call you after your sleep study to advise about the results (most likely, you will hear from Diana, my nurse) and to set up an appointment at the time, as necessary.    Our sleep lab administrative assistant, Dawn will meet with you or call you to schedule your sleep study. If you don't hear back from her by about 2 weeks from now, please feel free to call her at [phone]. This is her direct line and please leave a message with your phone number to call back if you get the voicemail box. She will call back as soon as possible.   

## 2017-03-25 ENCOUNTER — Telehealth: Payer: Self-pay | Admitting: Internal Medicine

## 2017-03-25 NOTE — Telephone Encounter (Signed)
noted 

## 2017-03-29 ENCOUNTER — Encounter: Payer: Medicare HMO | Admitting: Internal Medicine

## 2017-04-05 ENCOUNTER — Ambulatory Visit (INDEPENDENT_AMBULATORY_CARE_PROVIDER_SITE_OTHER): Payer: Medicare HMO

## 2017-04-05 VITALS — BP 152/84 | HR 98 | Temp 98.9°F | Ht 66.0 in | Wt 240.4 lb

## 2017-04-05 DIAGNOSIS — Z0000 Encounter for general adult medical examination without abnormal findings: Secondary | ICD-10-CM | POA: Diagnosis not present

## 2017-04-05 DIAGNOSIS — Z23 Encounter for immunization: Secondary | ICD-10-CM

## 2017-04-05 DIAGNOSIS — Z1211 Encounter for screening for malignant neoplasm of colon: Secondary | ICD-10-CM

## 2017-04-05 NOTE — Patient Instructions (Addendum)
[name] , Thank you for taking time to come for your Medicare Wellness Visit. I appreciate your ongoing commitment to your health goals. Please review the following plan we discussed and let me know if I can assist you in the future.   Screening recommendations/referrals: Colonoscopy: due, order put in the system, you will be contacted concerning appointment info. Mammogram: up to date, next due 02/03/2019 Bone Density: up to date, next due 08/23/2019 Recommended yearly ophthalmology/optometry visit for glaucoma screening and checkup Recommended yearly dental visit for hygiene and checkup  Vaccinations: Influenza vaccine: administered today Pneumococcal vaccine: up to date Tdap vaccine: up to date, next due 09/11/2019 Shingles vaccine: declined  Advanced directives: Advance directive discussed with you today. Even though you declined this today please call our office should you change your mind and we can give you the proper paperwork for you to fill out.   Conditions/risks identified: Keep exercising, eating less sugar and drinking a lot more water daily in order to help get to under 200 lbs.   Next appointment: schedule follow up with PCP, 1 year for AWV   Preventive Care 65 Years and Older, Female Preventive care refers to lifestyle choices and visits with your health care provider that can promote health and wellness. What does preventive care include?  A yearly physical exam. This is also called an annual well check.  Dental exams once or twice a year.  Routine eye exams. Ask your health care provider how often you should have your eyes checked.  Personal lifestyle choices, including:  Daily care of your teeth and gums.  Regular physical activity.  Eating a healthy diet.  Avoiding tobacco and drug use.  Limiting alcohol use.  Practicing safe sex.  Taking low-dose aspirin every day.  Taking vitamin and mineral supplements as recommended by your health care  provider. What happens during an annual well check? The services and screenings done by your health care provider during your annual well check will depend on your age, overall health, lifestyle risk factors, and family history of disease. Counseling  Your health care provider may ask you questions about your:  Alcohol use.  Tobacco use.  Drug use.  Emotional well-being.  Home and relationship well-being.  Sexual activity.  Eating habits.  History of falls.  Memory and ability to understand (cognition).  Work and work Astronomerenvironment.  Reproductive health. Screening  You may have the following tests or measurements:  Height, weight, and BMI.  Blood pressure.  Lipid and cholesterol levels. These may be checked every 5 years, or more frequently if you are over 70 years old.  Skin check.  Lung cancer screening. You may have this screening every year starting at age 70 if you have a 30-pack-year history of smoking and currently smoke or have quit within the past 15 years.  Fecal occult blood test (FOBT) of the stool. You may have this test every year starting at age 70.  Flexible sigmoidoscopy or colonoscopy. You may have a sigmoidoscopy every 5 years or a colonoscopy every 10 years starting at age 70.  Hepatitis C blood test.  Hepatitis B blood test.  Sexually transmitted disease (STD) testing.  Diabetes screening. This is done by checking your blood sugar (glucose) after you have not eaten for a while (fasting). You may have this done every 1-3 years.  Bone density scan. This is done to screen for osteoporosis. You may have this done starting at age 70.  Mammogram. This may be done every  1-2 years. Talk to your health care provider about how often you should have regular mammograms. Talk with your health care provider about your test results, treatment options, and if necessary, the need for more tests. Vaccines  Your health care provider may recommend certain  vaccines, such as:  Influenza vaccine. This is recommended every year.  Tetanus, diphtheria, and acellular pertussis (Tdap, Td) vaccine. You may need a Td booster every 10 years.  Zoster vaccine. You may need this after age 19.  Pneumococcal 13-valent conjugate (PCV13) vaccine. One dose is recommended after age 49.  Pneumococcal polysaccharide (PPSV23) vaccine. One dose is recommended after age 61. Talk to your health care provider about which screenings and vaccines you need and how often you need them. This information is not intended to replace advice given to you by your health care provider. Make sure you discuss any questions you have with your health care provider. Document Released: 08/08/2015 Document Revised: 03/31/2016 Document Reviewed: 05/13/2015 Elsevier Interactive Patient Education  2017 [place] Prevention in the Home Falls can cause injuries. They can happen to people of all ages. There are many things you can do to make your home safe and to help prevent falls. What can I do on the outside of my home?  Regularly fix the edges of walkways and driveways and fix any cracks.  Remove anything that might make you trip as you walk through a door, such as a raised step or threshold.  Trim any bushes or trees on the path to your home.  Use bright outdoor lighting.  Clear any walking paths of anything that might make someone trip, such as rocks or tools.  Regularly check to see if handrails are loose or broken. Make sure that both sides of any steps have handrails.  Any raised decks and porches should have guardrails on the edges.  Have any leaves, snow, or ice cleared regularly.  Use sand or salt on walking paths during winter.  Clean up any spills in your garage right away. This includes oil or grease spills. What can I do in the bathroom?  Use night lights.  Install grab bars by the toilet and in the tub and shower. Do not use towel bars as grab  bars.  Use non-skid mats or decals in the tub or shower.  If you need to sit down in the shower, use a plastic, non-slip stool.  Keep the floor dry. Clean up any water that spills on the floor as soon as it happens.  Remove soap buildup in the tub or shower regularly.  Attach bath mats securely with double-sided non-slip rug tape.  Do not have throw rugs and other things on the floor that can make you trip. What can I do in the bedroom?  Use night lights.  Make sure that you have a light by your bed that is easy to reach.  Do not use any sheets or blankets that are too big for your bed. They should not hang down onto the floor.  Have a firm chair that has side arms. You can use this for support while you get dressed.  Do not have throw rugs and other things on the floor that can make you trip. What can I do in the kitchen?  Clean up any spills right away.  Avoid walking on wet floors.  Keep items that you use a lot in easy-to-reach places.  If you need to reach something above you, use a strong  step stool that has a grab bar.  Keep electrical cords out of the way.  Do not use floor polish or wax that makes floors slippery. If you must use wax, use non-skid floor wax.  Do not have throw rugs and other things on the floor that can make you trip. What can I do with my stairs?  Do not leave any items on the stairs.  Make sure that there are handrails on both sides of the stairs and use them. Fix handrails that are broken or loose. Make sure that handrails are as long as the stairways.  Check any carpeting to make sure that it is firmly attached to the stairs. Fix any carpet that is loose or worn.  Avoid having throw rugs at the top or bottom of the stairs. If you do have throw rugs, attach them to the floor with carpet tape.  Make sure that you have a light switch at the top of the stairs and the bottom of the stairs. If you do not have them, ask someone to add them for  you. What else can I do to help prevent falls?  Wear shoes that:  Do not have high heels.  Have rubber bottoms.  Are comfortable and fit you well.  Are closed at the toe. Do not wear sandals.  If you use a stepladder:  Make sure that it is fully opened. Do not climb a closed stepladder.  Make sure that both sides of the stepladder are locked into place.  Ask someone to hold it for you, if possible.  Clearly mark and make sure that you can see:  Any grab bars or handrails.  First and last steps.  Where the edge of each step is.  Use tools that help you move around (mobility aids) if they are needed. These include:  Canes.  Walkers.  Scooters.  Crutches.  Turn on the lights when you go into a dark area. Replace any light bulbs as soon as they burn out.  Set up your furniture so you have a clear path. Avoid moving your furniture around.  If any of your floors are uneven, fix them.  If there are any pets around you, be aware of where they are.  Review your medicines with your doctor. Some medicines can make you feel dizzy. This can increase your chance of falling. Ask your doctor what other things that you can do to help prevent falls. This information is not intended to replace advice given to you by your health care provider. Make sure you discuss any questions you have with your health care provider. Document Released: 05/08/2009 Document Revised: 12/18/2015 Document Reviewed: 08/16/2014 Elsevier Interactive Patient Education  2017 [employer].

## 2017-04-05 NOTE — Progress Notes (Signed)
Subjective:   Tammy [name] is a 71 y.o. female who presents for Medicare Annual (Subsequent) preventive examination.  Review of Systems:  N/A Cardiac Risk Factors include: advanced age (>8855men, 36>65 women);dyslipidemia;hypertension;obesity (BMI >30kg/m2)     Objective:     Vitals: BP (!) 152/84   Pulse 98   Temp 98.9 F (37.2 C) (Oral)   Ht 5\' 6"  (1.676 m)   Wt 240 lb 6 oz (109 kg)   BMI 38.80 kg/m   Body mass index is 38.8 kg/m.   Tobacco History  Smoking Status  . Former Smoker  . Years: 3.00  . Quit date: 07/26/1973  Smokeless Tobacco  . Never Used     Counseling given: Not Answered   Past Medical History:  Diagnosis Date  . Colon polyps   . Hypertension    Past Surgical History:  Procedure Laterality Date  . ABDOMINAL HYSTERECTOMY  1982  . BUNIONECTOMY Bilateral    Family History  Problem Relation Age of Onset  . Diabetes Mother   . Diabetes Sister   . Diabetes Brother   . Diabetes Sister    History  Sexual Activity  . Sexual activity: Not on file    Outpatient Encounter Prescriptions as of 04/05/2017  Medication Sig  . aspirin EC 81 MG tablet Take 1 tablet (81 mg total) by mouth daily.  . Cholecalciferol (VITAMIN D3) 400 units CAPS Take by mouth.  . folic acid (FOLVITE) 800 MCG tablet Take 400 mcg by mouth daily.  . furosemide (LASIX) 20 MG tablet Take 1 tablet (20 mg total) by mouth daily.  . metoprolol tartrate (LOPRESSOR) 25 MG tablet Take 1 tablet (25 mg total) by mouth daily.  . Multiple Vitamins-Minerals (ALIVE ONCE DAILY WOMENS PO) Take 1 tablet by mouth daily.  . Omega-3 1000 MG CAPS Take by mouth.  . vitamin E 400 UNIT capsule Take 400 Units by mouth daily.   No facility-administered encounter medications on file as of 04/05/2017.     Activities of Daily Living In your present state of health, do you have any difficulty performing the following activities: 04/05/2017  Hearing? N  Vision? N  Difficulty concentrating or making  decisions? N  Walking or climbing stairs? N  Dressing or bathing? N  Doing errands, shopping? N  Preparing Food and eating ? N  Using the Toilet? N  In the past six months, have you accidently leaked urine? Y  Comment Patient wears a belt around her waist and it has resolved her urine leakage problem.   Do you have problems with loss of bowel control? N  Managing your Medications? N  Managing your Finances? N  Housekeeping or managing your Housekeeping? N  Some recent data might be hidden    Patient Care Team: Doristine BosworthStallings, Zoe A, MD as PCP - General (Internal Medicine)    Assessment:     Exercise Activities and Dietary recommendations Current Exercise Habits: Home exercise routine, Type of exercise: stretching, Time (Minutes): 20, Frequency (Times/Week): 3, Weekly Exercise (Minutes/Week): 60, Intensity: Mild, Exercise limited by: None identified  Goals    . Weight (lb) < 200 lb (90.7 kg)          Patient is exercising, eating less sugar and drinking a lot more water daily in order to help her get to under 200 lbs.       Fall Risk Fall Risk  04/05/2017 03/16/2017 01/15/2017 12/15/2016 12/09/2016  Falls in the past year? No No No No No  Depression Screen PHQ 2/9 Scores 04/05/2017 01/15/2017 12/15/2016 12/09/2016  PHQ - 2 Score 0 0 0 0     Cognitive Function     6CIT Screen 04/05/2017  What Year? 0 points  What month? 0 points  What time? 0 points  Count back from 20 0 points  Months in reverse 0 points  Repeat phrase 10 points  Total Score 10    Immunization History  Administered Date(s) Administered  . Influenza,inj,Quad PF,6+ Mos 04/05/2017  . Influenza-Unspecified 09/09/2015  . Pneumococcal Conjugate-13 08/22/2014  . Pneumococcal Polysaccharide-23 07/30/2013  . Tdap 09/10/2009   Screening Tests Health Maintenance  Topic Date Due  . COLONOSCOPY  05/25/2017 (Originally 06/06/2013)  . MAMMOGRAM  02/03/2019  . TETANUS/TDAP  09/11/2019  . INFLUENZA VACCINE   Completed  . DEXA SCAN  Completed  . Hepatitis C Screening  Completed  . PNA vac Low Risk Adult  Completed      Plan:   I have personally reviewed and noted the following in the patient's chart:   . Medical and social history . Use of alcohol, tobacco or illicit drugs  . Current medications and supplements . Functional ability and status . Nutritional status . Physical activity . Advanced directives . List of other physicians . Hospitalizations, surgeries, and ER visits in previous 12 months . Vitals . Screenings to include cognitive, depression, and falls . Referrals and appointments  In addition, I have reviewed and discussed with patient certain preventive protocols, quality metrics, and best practice recommendations. A written personalized care plan for preventive services as well as general preventive health recommendations were provided to patient.  Referral for colonoscopy in system.    Janalyn Shy, LPN  1/61/0960

## 2017-05-13 ENCOUNTER — Encounter: Payer: Self-pay | Admitting: Family Medicine

## 2017-06-07 ENCOUNTER — Other Ambulatory Visit: Payer: Self-pay | Admitting: Family Medicine

## 2017-08-03 ENCOUNTER — Ambulatory Visit (INDEPENDENT_AMBULATORY_CARE_PROVIDER_SITE_OTHER): Payer: Medicare HMO | Admitting: Family Medicine

## 2017-08-03 ENCOUNTER — Encounter: Payer: Self-pay | Admitting: Family Medicine

## 2017-08-03 DIAGNOSIS — Z23 Encounter for immunization: Secondary | ICD-10-CM

## 2017-08-05 ENCOUNTER — Other Ambulatory Visit: Payer: Self-pay

## 2017-08-05 ENCOUNTER — Ambulatory Visit (INDEPENDENT_AMBULATORY_CARE_PROVIDER_SITE_OTHER): Payer: Medicare HMO

## 2017-08-05 ENCOUNTER — Ambulatory Visit: Payer: Medicare HMO | Admitting: Physician Assistant

## 2017-08-05 ENCOUNTER — Encounter: Payer: Self-pay | Admitting: Physician Assistant

## 2017-08-05 VITALS — BP 152/80 | HR 108 | Temp 98.2°F | Resp 16 | Ht 66.93 in | Wt 229.0 lb

## 2017-08-05 DIAGNOSIS — R0981 Nasal congestion: Secondary | ICD-10-CM | POA: Diagnosis not present

## 2017-08-05 DIAGNOSIS — R059 Cough, unspecified: Secondary | ICD-10-CM

## 2017-08-05 DIAGNOSIS — R05 Cough: Secondary | ICD-10-CM

## 2017-08-05 MED ORDER — BENZONATATE 100 MG PO CAPS: 100.0000 mg | ORAL_CAPSULE | Freq: Three times a day (TID) | ORAL | 0 refills | Status: DC | PRN

## 2017-08-05 MED ORDER — DOXYCYCLINE HYCLATE 100 MG PO CAPS
100.0000 mg | ORAL_CAPSULE | Freq: Two times a day (BID) | ORAL | 0 refills | Status: DC
Start: 2017-08-05 — End: 2017-09-01

## 2017-08-05 MED ORDER — IPRATROPIUM BROMIDE 0.03 % NA SOLN: 2.0000 | Freq: Two times a day (BID) | NASAL | 0 refills | Status: DC

## 2017-08-05 NOTE — Progress Notes (Signed)
MRN: [id number] DOB: 01/12/1946  Subjective:   Tammy [name] is a 72 y.o. female presenting for chief complaint of Cough (pt states she has been having this chronic cough for 1 month. Pt states she has been coughinh up green mucus and having some sinues pressure ) .  Reports 1 month history of illness. Started out with feeling like she got hit by a car, having generalized body aches and head cold symptoms. She then started feeling better but developed a productive cough. Will have intermittent fever and chills. Has occassional SOB when she coughs so much. Has tried every kind of OTC cough/cold medication she can think of with no full relief. Denies  difficulty swallowing, wheezing, chest pain, lower leg swelling,  night sweats, nausea, vomiting, abdominal pain and diarrhea. Has not had sick contact with anyone. Denies history of seasonal allergies, history of asthma, and history of COPD. Patient has had flu shot this season. Denies smoking. No recent travel or hospitalizations.  Denies any other aggravating or relieving factors, no other questions or concerns.  Shantanique has a current medication list which includes the following prescription(s): aspirin ec, vitamin d3, folic acid, furosemide, metoprolol tartrate, multiple vitamins-minerals, omega-3, vitamin e, benzonatate, doxycycline, and ipratropium. Also is allergic to neurontin [gabapentin].  Waver  has a past medical history of Colon polyps and Hypertension. Also  has a past surgical history that includes Abdominal hysterectomy (1982) and Bunionectomy (Bilateral).   Objective:   Vitals: BP (!) 152/80   Pulse (!) 108   Temp 98.2 F (36.8 C) (Oral)   Resp 16   Ht 5' 6.93" (1.7 m)   Wt 229 lb (103.9 kg)   SpO2 98%   BMI 35.94 kg/m   Physical Exam  Constitutional: She is oriented to person, place, and time. She appears well-developed and well-nourished. She appears distressed (appears like she does not feel well sitting on exam table).    HENT:  Head: Normocephalic and atraumatic.  Right Ear: Tympanic membrane, external ear and ear canal normal.  Left Ear: Tympanic membrane, external ear and ear canal normal.  Nose: Mucosal edema ( mild) and rhinorrhea present. Right sinus exhibits no maxillary sinus tenderness and no frontal sinus tenderness. Left sinus exhibits no maxillary sinus tenderness and no frontal sinus tenderness.  Mouth/Throat: Uvula is midline, oropharynx is clear and moist and mucous membranes are normal. No tonsillar exudate.  Eyes: Conjunctivae are normal.  Neck: Normal range of motion.  Cardiovascular: Regular rhythm and normal heart sounds. Tachycardia present.  Pulmonary/Chest: Effort normal and breath sounds normal. No accessory muscle usage. No tachypnea. No respiratory distress. She has no wheezes. She has no rhonchi. She has no rales.  Musculoskeletal:       Right lower leg: She exhibits no swelling.       Left lower leg: She exhibits no edema.  Lymphadenopathy:       Head (right side): No submental, no submandibular, no tonsillar, no preauricular, no posterior auricular and no occipital adenopathy present.       Head (left side): No submental, no submandibular, no tonsillar, no preauricular, no posterior auricular and no occipital adenopathy present.    She has no cervical adenopathy.       Right: No supraclavicular adenopathy present.       Left: No supraclavicular adenopathy present.  Neurological: She is alert and oriented to person, place, and time.  Skin: Skin is warm and dry.  Psychiatric: She has a normal mood and affect.  Vitals reviewed.   No results found for this or any previous visit (from the past 24 hour(s)).  Dg Chest 2 View  Result Date: 08/05/2017 CLINICAL DATA:  Productive cough for 1 month EXAM: CHEST  2 VIEW COMPARISON:  September 02, 2015 FINDINGS: The heart size and mediastinal contours are stable. There is no focal infiltrate, pulmonary edema, or pleural effusion. The  visualized skeletal structures are stable. IMPRESSION: No active cardiopulmonary disease. Electronically Signed   By: Sherian ReinWei-Chen  Lin M.D.   On: 08/05/2017 15:10    Assessment and Plan :  1. Cough Patient does appear as if she does not feel well, but is overall stable.  Lungs are CTAB.  Patient is afebrile.  Denies chest pain.  Chest x-ray  shows no active cardiopulmonary disease.  Due to duration of symptoms, with no relief with symptomatic treatment, will cover for atypical bacterial organisms at this time.  Will also provide symptomatic treatment.  Advised to return to clinic if symptoms worsen, do not improve, or as needed - DG Chest 2 View; Future - benzonatate (TESSALON) 100 MG capsule; Take 1-2 capsules (100-200 mg total) by mouth 3 (three) times daily as needed for cough.  Dispense: 40 capsule; Refill: 0 - doxycycline (VIBRAMYCIN) 100 MG capsule; Take 1 capsule (100 mg total) by mouth 2 (two) times daily.  Dispense: 20 capsule; Refill: 0  2. Nasal congestion - ipratropium (ATROVENT) 0.03 % nasal spray; Place 2 sprays into both nostrils 2 (two) times daily.  Dispense: 30 mL; Refill: 0  Benjiman CoreBrittany Callia Swim, PA-C  [facility] 08/05/2017 6:06 PM

## 2017-08-05 NOTE — Patient Instructions (Addendum)
-   Your xray was normal but we will treat this for underlying bacterial etiology at this time.  - I recommend you rest, drink plenty of fluids, eat light meals including soups.  - You may use delsym cough syrup at night for your cough and sore throat, Tessalon pearls during the day. - You may use nasal spray for your nasal congestion ans runny nose. - Please let me know if you are not seeing any improvement or get worse in 7-10 days.   Acute Bronchitis, Adult Acute bronchitis is when air tubes (bronchi) in the lungs suddenly get swollen. The condition can make it hard to breathe. It can also cause these symptoms:  A cough.  Coughing up clear, yellow, or green mucus.  Wheezing.  Chest congestion.  Shortness of breath.  A fever.  Body aches.  Chills.  A sore throat.  Follow these instructions at home: Medicines  Take over-the-counter and prescription medicines only as told by your doctor.  If you were prescribed an antibiotic medicine, take it as told by your doctor. Do not stop taking the antibiotic even if you start to feel better. General instructions  Rest.  Drink enough fluids to keep your pee (urine) clear or pale yellow.  Avoid smoking and secondhand smoke. If you smoke and you need help quitting, ask your doctor. Quitting will help your lungs heal faster.  Use an inhaler, cool mist vaporizer, or humidifier as told by your doctor.  Keep all follow-up visits as told by your doctor. This is important. How is this prevented? To lower your risk of getting this condition again:  Wash your hands often with soap and water. If you cannot use soap and water, use hand sanitizer.  Avoid contact with people who have cold symptoms.  Try not to touch your hands to your mouth, nose, or eyes.  Make sure to get the flu shot every year.  Contact a doctor if:  Your symptoms do not get better in 2 weeks. Get help right away if:  You cough up blood.  You have chest  pain.  You have very bad shortness of breath.  You become dehydrated.  You faint (pass out) or keep feeling like you are going to pass out.  You keep throwing up (vomiting).  You have a very bad headache.  Your fever or chills gets worse. This information is not intended to replace advice given to you by your health care provider. Make sure you discuss any questions you have with your health care provider. Document Released: 12/29/2007 Document Revised: 02/18/2016 Document Reviewed: 12/31/2015 Elsevier Interactive Patient Education  2018 [employer]   IF you received an x-ray today, you will receive an invoice from [facility]. Please contact [facility] at [phone] with questions or concerns regarding your invoice.   IF you received labwork today, you will receive an invoice from [facility]. Please contact [facility] at [phone] with questions or concerns regarding your invoice.   Our billing staff will not be able to assist you with questions regarding bills from these companies.  You will be contacted with the lab results as soon as they are available. The fastest way to get your results is to activate your My Chart account. Instructions are located on the last page of this paperwork. If you have not heard from us regarding the results in 2 weeks, please contact this office.

## 2017-08-17 NOTE — Progress Notes (Signed)
Influenza vaccine visit only.

## 2017-08-22 ENCOUNTER — Encounter: Payer: Self-pay | Admitting: Family Medicine

## 2017-08-22 DIAGNOSIS — K5730 Diverticulosis of large intestine without perforation or abscess without bleeding: Secondary | ICD-10-CM | POA: Insufficient documentation

## 2017-09-01 ENCOUNTER — Other Ambulatory Visit: Payer: Self-pay

## 2017-09-01 ENCOUNTER — Ambulatory Visit (INDEPENDENT_AMBULATORY_CARE_PROVIDER_SITE_OTHER): Payer: Medicare HMO | Admitting: Family Medicine

## 2017-09-01 ENCOUNTER — Encounter: Payer: Self-pay | Admitting: Family Medicine

## 2017-09-01 VITALS — BP 145/88 | HR 98 | Temp 99.0°F | Resp 17 | Ht 66.93 in | Wt 227.8 lb

## 2017-09-01 DIAGNOSIS — K644 Residual hemorrhoidal skin tags: Secondary | ICD-10-CM

## 2017-09-01 NOTE — Patient Instructions (Addendum)
Preparation H wipes or witch hazel wipes    IF you received an x-ray today, you will receive an invoice from [facility]. Please contact [facility] at [phone] with questions or concerns regarding your invoice.   IF you received labwork today, you will receive an invoice from [place]. Please contact [facility] at [phone] with questions or concerns regarding your invoice.   Our billing staff will not be able to assist you with questions regarding bills from these companies.  You will be contacted with the lab results as soon as they are available. The fastest way to get your results is to activate your My Chart account. Instructions are located on the last page of this paperwork. If you have not heard from [place] regarding the results in 2 weeks, please contact this office.     Hemorrhoids Hemorrhoids are swollen veins in and around the rectum or anus. There are two types of hemorrhoids:  Internal hemorrhoids. These occur in the veins that are just inside the rectum. They may poke through to the outside and become irritated and painful.  External hemorrhoids. These occur in the veins that are outside of the anus and can be felt as a painful swelling or hard lump near the anus.  Most hemorrhoids do not cause serious problems, and they can be managed with home treatments such as diet and lifestyle changes. If home treatments do not help your symptoms, procedures can be done to shrink or remove the hemorrhoids. What are the causes? This condition is caused by increased pressure in the anal area. This pressure may result from various things, including:  Constipation.  Straining to have a bowel movement.  Diarrhea.  Pregnancy.  Obesity.  Sitting for long periods of time.  Heavy lifting or other activity that causes you to strain.  Anal sex.  What are the signs or symptoms? Symptoms of this condition include:  Pain.  Anal itching or  irritation.  Rectal bleeding.  Leakage of stool (feces).  Anal swelling.  One or more lumps around the anus.  How is this diagnosed? This condition can often be diagnosed through a visual exam. Other exams or tests may also be done, such as:  Examination of the rectal area with a gloved hand (digital rectal exam).  Examination of the anal canal using a small tube (anoscope).  A blood test, if you have lost a significant amount of blood.  A test to look inside the colon (sigmoidoscopy or colonoscopy).  How is this treated? This condition can usually be treated at home. However, various procedures may be done if dietary changes, lifestyle changes, and other home treatments do not help your symptoms. These procedures can help make the hemorrhoids smaller or remove them completely. Some of these procedures involve surgery, and others do not. Common procedures include:  Rubber band ligation. Rubber bands are placed at the base of the hemorrhoids to cut off the blood supply to them.  Sclerotherapy. Medicine is injected into the hemorrhoids to shrink them.  Infrared coagulation. A type of light energy is used to get rid of the hemorrhoids.  Hemorrhoidectomy surgery. The hemorrhoids are surgically removed, and the veins that supply them are tied off.  Stapled hemorrhoidopexy surgery. A circular stapling device is used to remove the hemorrhoids and use staples to cut off the blood supply to them.  Follow these instructions at home: Eating and drinking  Eat foods that have a lot of fiber in them, such as whole grains, beans, nuts,  fruits, and vegetables. Ask your health care provider about taking products that have added fiber (fiber supplements).  Drink enough fluid to keep your urine clear or pale yellow. Managing pain and swelling  Take warm sitz baths for 20 minutes, 3-4 times a day to ease pain and discomfort.  If directed, apply ice to the affected area. Using ice packs  between sitz baths may be helpful. ? Put ice in a plastic bag. ? Place a towel between your skin and the bag. ? Leave the ice on for 20 minutes, 2-3 times a day. General instructions  Take over-the-counter and prescription medicines only as told by your health care provider.  Use medicated creams or suppositories as told.  Exercise regularly.  Go to the bathroom when you have the urge to have a bowel movement. Do not wait.  Avoid straining to have bowel movements.  Keep the anal area dry and clean. Use wet toilet paper or moist towelettes after a bowel movement.  Do not sit on the toilet for long periods of time. This increases blood pooling and pain. Contact a health care provider if:  You have increasing pain and swelling that are not controlled by treatment or medicine.  You have uncontrolled bleeding.  You have difficulty having a bowel movement, or you are unable to have a bowel movement.  You have pain or inflammation outside the area of the hemorrhoids. This information is not intended to replace advice given to you by your health care provider. Make sure you discuss any questions you have with your health care provider. Document Released: 07/09/2000 Document Revised: 12/10/2015 Document Reviewed: 03/26/2015 Elsevier Interactive Patient Education  2018 [employer]  How to Take a [employer] Bath A sitz bath is a warm water bath that is taken while you are sitting down. The water should only come up to your hips and should cover your buttocks. Your health care provider may recommend a sitz bath to help you:  Clean the lower part of your body, including your genital area.  With itching.  With pain.  With sore muscles or muscles that tighten or spasm.  How to take a sitz bath Take 3-4 sitz baths per day or as told by your health care provider. 1. Partially fill a bathtub with warm water. You will only need the water to be deep enough to cover your hips and buttocks when  you are sitting in it. 2. If your health care provider told you to put medicine in the water, follow the directions exactly. 3. Sit in the water and open the tub drain a little. 4. Turn on the warm water again to keep the tub at the correct level. Keep the water running constantly. 5. Soak in the water for 15-20 minutes or as told by your health care provider. 6. After the sitz bath, pat the affected area dry first. Do not rub it. 7. Be careful when you stand up after the sitz bath because you may feel dizzy.  Contact a health care provider if:  Your symptoms get worse. Do not continue with sitz baths if your symptoms get worse.  You have new symptoms. Do not continue with sitz baths until you talk with your health care provider. This information is not intended to replace advice given to you by your health care provider. Make sure you discuss any questions you have with your health care provider. Document Released: 04/03/2004 Document Revised: 12/10/2015 Document Reviewed: 07/10/2014 Elsevier Interactive Patient Education  2018  [employer].

## 2017-09-01 NOTE — Progress Notes (Signed)
Chief Complaint  Patient presents with  . bump near rectum    noticed bump while wiping yesterday, per pt she thinks it is on the inside of rectum, some pain but more pain in abdomen and radiates down leg    HPI  Patient noticed bump near her rectum when wiping after a BM yesterday Patient also feels like something is "coming out" of her rectum from the inside She sees no blood on the toilet paper or in the toilet Has itching and mild pain Denies weight loss, constipation, fevers or chills  Past Medical History:  Diagnosis Date  . Colon polyps   . Hypertension     Current Outpatient Medications  Medication Sig Dispense Refill  . aspirin EC 81 MG tablet Take 1 tablet (81 mg total) by mouth daily. 90 tablet 3  . Cholecalciferol (VITAMIN D3) 400 units CAPS Take by mouth.    . folic acid (FOLVITE) 800 MCG tablet Take 400 mcg by mouth daily.    . furosemide (LASIX) 20 MG tablet TAKE 1 TABLET EVERY DAY 90 tablet 1  . ipratropium (ATROVENT) 0.03 % nasal spray Place 2 sprays into both nostrils 2 (two) times daily. 30 mL 0  . metoprolol tartrate (LOPRESSOR) 25 MG tablet TAKE 1 TABLET EVERY DAY 90 tablet 1  . Multiple Vitamins-Minerals (ALIVE ONCE DAILY WOMENS PO) Take 1 tablet by mouth daily.    . Omega-3 1000 MG CAPS Take by mouth.    . vitamin E 400 UNIT capsule Take 400 Units by mouth daily.     No current facility-administered medications for this visit.     Allergies:  Allergies  Allergen Reactions  . Neurontin [Gabapentin] Itching    Past Surgical History:  Procedure Laterality Date  . ABDOMINAL HYSTERECTOMY  1982  . BUNIONECTOMY Bilateral     Social History   Socioeconomic History  . Marital status: Divorced    Spouse name: None  . Number of children: 2  . Years of education: None  . Highest education level: None  Social Needs  . Financial resource strain: None  . Food insecurity - worry: None  . Food insecurity - inability: None  . Transportation needs -  medical: None  . Transportation needs - non-medical: None  Occupational History  . Occupation: retired  Tobacco Use  . Smoking status: Former Smoker    Years: 3.00    Last attempt to quit: 07/26/1973    Years since quitting: 44.1  . Smokeless tobacco: Never Used  Substance and Sexual Activity  . Alcohol use: No  . Drug use: No  . Sexual activity: None  Other Topics Concern  . None  Social History Narrative  . None    Family History  Problem Relation Age of Onset  . Diabetes Mother   . Diabetes Sister   . Diabetes Brother   . Diabetes Sister      ROS Review of Systems See HPI Constitution: No fevers or chills No malaise No diaphoresis Skin: No rash or itching Eyes: no blurry vision, no double vision GU: no dysuria or hematuria Neuro: no dizziness or headaches all others reviewed and negative   Objective: Vitals:   09/01/17 1731 09/01/17 1745  BP: (!) 145/88   Pulse: (!) 119 98  Resp: 17   Temp: 99 F (37.2 C)   TempSrc: Oral   SpO2: 99%   Weight: 227 lb 12.8 oz (103.3 kg)   Height: 5' 6.93" (1.7 m)     Physical Exam  Constitutional: She is oriented to person, place, and time. She appears well-developed and well-nourished.  HENT:  Head: Normocephalic and atraumatic.  Eyes: Conjunctivae and EOM are normal.  Cardiovascular: Normal rate, regular rhythm and normal heart sounds.  No murmur heard. Pulmonary/Chest: Effort normal and breath sounds normal. No stridor. No respiratory distress.  Genitourinary:  Genitourinary Comments: Chaperone present Visible external hemorrhoid, no thrombosis Normal rectal tone Normal anal wink  Neurological: She is alert and oriented to person, place, and time.      Assessment and Plan Earla was seen today for bump near rectum.  Diagnoses and all orders for this visit:  External hemorrhoids  - no thrombosis noted Advised pt to follow up if it shows sign of thrombosis Advised tucks pads and sitz bath Advised bid  metamucil before meals   Zoe A Creta Levin

## 2017-09-21 ENCOUNTER — Other Ambulatory Visit: Payer: Self-pay

## 2017-09-21 DIAGNOSIS — R0981 Nasal congestion: Secondary | ICD-10-CM

## 2017-09-21 MED ORDER — IPRATROPIUM BROMIDE 0.03 % NA SOLN: 2.0000 | Freq: Two times a day (BID) | NASAL | 0 refills | Status: DC

## 2017-10-11 ENCOUNTER — Other Ambulatory Visit: Payer: Self-pay

## 2017-10-11 DIAGNOSIS — R0981 Nasal congestion: Secondary | ICD-10-CM

## 2017-12-20 ENCOUNTER — Encounter: Payer: Self-pay | Admitting: Family Medicine

## 2018-02-10 ENCOUNTER — Ambulatory Visit: Payer: Self-pay | Admitting: Emergency Medicine

## 2018-03-07 ENCOUNTER — Other Ambulatory Visit: Payer: Self-pay | Admitting: Family Medicine

## 2018-03-09 ENCOUNTER — Other Ambulatory Visit: Payer: Self-pay | Admitting: Family Medicine

## 2018-03-09 DIAGNOSIS — Z1231 Encounter for screening mammogram for malignant neoplasm of breast: Secondary | ICD-10-CM

## 2018-03-18 ENCOUNTER — Ambulatory Visit: Payer: Medicare HMO | Admitting: Family Medicine

## 2018-03-20 ENCOUNTER — Other Ambulatory Visit: Payer: Self-pay | Admitting: Family Medicine

## 2018-03-20 NOTE — Telephone Encounter (Signed)
Patient called and advised it is due for a physical, she agrees to an appointment, but says it has to be on a Tuesday in the afternoon. I advised no available afternoon appointments and the first available for a physical is 05/09/18, she agrees to that appointment at 0800 with Dr. Creta LevinStallings, advised to remain NPO for tentative lab draw, she verbalized understanding. Refilled medications requested until her appointment.

## 2018-04-11 ENCOUNTER — Ambulatory Visit
Admission: RE | Admit: 2018-04-11 | Discharge: 2018-04-11 | Disposition: A | Discharge status: Home / Self Care | Payer: Medicare HMO | Source: Ambulatory Visit | Attending: Family Medicine | Admitting: Family Medicine

## 2018-04-11 DIAGNOSIS — Z1231 Encounter for screening mammogram for malignant neoplasm of breast: Secondary | ICD-10-CM

## 2018-05-09 ENCOUNTER — Encounter: Payer: Self-pay | Admitting: Family Medicine

## 2018-05-09 ENCOUNTER — Other Ambulatory Visit: Payer: Self-pay

## 2018-05-09 ENCOUNTER — Ambulatory Visit (INDEPENDENT_AMBULATORY_CARE_PROVIDER_SITE_OTHER): Payer: Medicare HMO | Admitting: Family Medicine

## 2018-05-09 VITALS — BP 146/80 | HR 97 | Temp 98.2°F | Resp 17 | Ht 66.93 in | Wt 248.4 lb

## 2018-05-09 DIAGNOSIS — E782 Mixed hyperlipidemia: Secondary | ICD-10-CM | POA: Diagnosis not present

## 2018-05-09 DIAGNOSIS — Z0000 Encounter for general adult medical examination without abnormal findings: Secondary | ICD-10-CM

## 2018-05-09 DIAGNOSIS — Z823 Family history of stroke: Secondary | ICD-10-CM | POA: Diagnosis not present

## 2018-05-09 DIAGNOSIS — Z23 Encounter for immunization: Secondary | ICD-10-CM | POA: Diagnosis not present

## 2018-05-09 DIAGNOSIS — I10 Essential (primary) hypertension: Secondary | ICD-10-CM

## 2018-05-09 LAB — LIPID PANEL
Chol/HDL Ratio: 4.8 ratio — ABNORMAL HIGH (ref 0.0–4.4)
Cholesterol, Total: 252 mg/dL — ABNORMAL HIGH (ref 100–199)
HDL: 52 mg/dL (ref >39)
LDL Calculated: 186 mg/dL — ABNORMAL HIGH (ref 0–99)
Triglycerides: 68 mg/dL (ref 0–149)
VLDL Cholesterol Cal: 14 mg/dL (ref 5–40)

## 2018-05-09 LAB — COMPREHENSIVE METABOLIC PANEL
ALT: 11 IU/L (ref 0–32)
AST: 12 IU/L (ref 0–40)
Albumin/Globulin Ratio: 1.4 (ref 1.2–2.2)
Albumin: 4.2 g/dL (ref 3.5–4.8)
Alkaline Phosphatase: 98 IU/L (ref 39–117)
BUN/Creatinine Ratio: 16 (ref 12–28)
BUN: 15 mg/dL (ref 8–27)
Bilirubin Total: <0.2 mg/dL (ref 0.0–1.2)
CO2: 24 mmol/L (ref 20–29)
Calcium: 9.5 mg/dL (ref 8.7–10.3)
Chloride: 101 mmol/L (ref 96–106)
Creatinine, Ser: 0.91 mg/dL (ref 0.57–1.00)
GFR calc Af Amer: 73 mL/min/{1.73_m2} (ref >59)
GFR calc non Af Amer: 64 mL/min/{1.73_m2} (ref >59)
Globulin, Total: 3 g/dL (ref 1.5–4.5)
Glucose: 100 mg/dL — ABNORMAL HIGH (ref 65–99)
Potassium: 4.9 mmol/L (ref 3.5–5.2)
Sodium: 140 mmol/L (ref 134–144)
Total Protein: 7.2 g/dL (ref 6.0–8.5)

## 2018-05-09 MED ORDER — ROSUVASTATIN CALCIUM 20 MG PO TABS: 20.0000 mg | ORAL_TABLET | ORAL | 0 refills | Status: AC

## 2018-05-09 NOTE — Progress Notes (Signed)
QUICK REFERENCE INFORMATION: The ABCs of Providing the Annual Wellness Visit  CMS.gov Medicare Learning Network  [employer] Annual Wellness Visit  Subjective:   Tammy [name] is a 72 y.o. Female who presents for an Annual Wellness Visit.  hypertension Pt reports that her bp is elevated She has not taken her bp meds this morning \\she  states that she has high readings at night She typically gets headaches in the morning when her bp is high  BP Readings from Last 3 Encounters:  05/09/18 (!) 146/80  09/01/17 (!) 145/88  08/05/17 (!) 152/80   dyslipidemia She has been on cholesterol medications in the past but stopped due to side effects.  She reports that she tried lipitor in the past. She reports that she had a sister who had a stroke at age 60 She denies dizziness, chest pain She takes an aspirin The 10-year ASCVD risk score Denman George DC Montez Hageman., et al., 2013) is: 18.4%   Values used to calculate the score:     Age: 11 years     Sex: Female     Is Non-Hispanic African American: Yes     Diabetic: No     Tobacco smoker: No     Systolic Blood Pressure: 146 mmHg     Is BP treated: Yes     HDL Cholesterol: 50 mg/dL     Total Cholesterol: 243 mg/dL   Patient Active Problem List   Diagnosis Date Noted  . Diverticulosis of large intestine 08/22/2017  . Statin intolerance 10/20/2016  . Cardiomegaly 10/20/2016  . SHOULDER PAIN, LEFT 08/20/2008  . Pain in limb 02/23/2008  . CHEST PAIN 01/11/2008  . HERPES ZOSTER 01/04/2008  . HYPERLIPIDEMIA 01/04/2008  . ESSENTIAL HYPERTENSION, BENIGN 01/04/2008    Past Medical History:  Diagnosis Date  . Colon polyps   . Hypertension      Past Surgical History:  Procedure Laterality Date  . ABDOMINAL HYSTERECTOMY  1982  . BUNIONECTOMY Bilateral      Outpatient Medications Prior to Visit  Medication Sig Dispense Refill  . aspirin EC 81 MG tablet Take 1 tablet (81 mg total) by mouth daily. 90 tablet 3  . Cholecalciferol (VITAMIN D3) 400  units CAPS Take by mouth.    . folic acid (FOLVITE) 800 MCG tablet Take 400 mcg by mouth daily.    . furosemide (LASIX) 20 MG tablet TAKE 1 TABLET EVERY DAY 60 tablet 0  . ipratropium (ATROVENT) 0.03 % nasal spray Place 2 sprays into both nostrils 2 (two) times daily. 30 mL 0  . metoprolol tartrate (LOPRESSOR) 25 MG tablet TAKE 1 TABLET EVERY DAY 60 tablet 0  . Multiple Vitamins-Minerals (ALIVE ONCE DAILY WOMENS PO) Take 1 tablet by mouth daily.    . Omega-3 1000 MG CAPS Take by mouth.    . vitamin E 400 UNIT capsule Take 400 Units by mouth daily.     No facility-administered medications prior to visit.     Allergies  Allergen Reactions  . Neurontin [Gabapentin] Itching     Family History  Problem Relation Age of Onset  . Diabetes Mother   . Diabetes Sister   . Diabetes Brother   . Diabetes Sister      Social History   Socioeconomic History  . Marital status: Divorced    Spouse name: Not on file  . Number of children: 2  . Years of education: Not on file  . Highest education level: Not on file  Occupational History  . Occupation: retired  Social Needs  . Financial resource strain: Not on file  . Food insecurity:    Worry: Not on file    Inability: Not on file  . Transportation needs:    Medical: Not on file    Non-medical: Not on file  Tobacco Use  . Smoking status: Former Smoker    Years: 3.00    Last attempt to quit: 07/26/1973    Years since quitting: 44.8  . Smokeless tobacco: Never Used  Substance and Sexual Activity  . Alcohol use: No  . Drug use: No  . Sexual activity: Not on file  Lifestyle  . Physical activity:    Days per week: Not on file    Minutes per session: Not on file  . Stress: Not on file  Relationships  . Social connections:    Talks on phone: Not on file    Gets together: Not on file    Attends religious service: Not on file    Active member of club or organization: Not on file    Attends meetings of clubs or organizations: Not on  file    Relationship status: Not on file  Other Topics Concern  . Not on file  Social History Narrative  . Not on file      Recent Hospitalizations? No  Health Habits: Current exercise activities include: none Exercise: 0 times/week. Diet: in general, a "healthy" diet    Alcohol intake: none  Health Risk Assessment: The patient has completed a Health Risk Assessment. This has been reveiwed with them and has been scanned into the [place] system as an attached document.  Current Medical Providers and Suppliers: [facility] Patient Care Team: Doristine Bosworth, MD as PCP - General (Internal Medicine) No future appointments.   Age-appropriate Screening Schedule: The list below includes current immunization status and future screening recommendations based on patient's age. Orders for these recommended tests are listed in the plan section. The patient has been provided with a written plan. Immunization History  Administered Date(s) Administered  . Influenza, High Dose Seasonal PF 05/09/2018  . Influenza,inj,Quad PF,6+ Mos 04/05/2017, 08/03/2017  . Influenza-Unspecified 09/09/2015  . Pneumococcal Conjugate-13 08/22/2014  . Pneumococcal Polysaccharide-23 07/30/2013  . Tdap 09/10/2009    Health Maintenance reviewed  Depression Screen-PHQ2/9 completed today  Depression screen [facility] 2/9 05/09/2018 05/09/2018 09/01/2017 08/05/2017 04/05/2017  Decreased Interest 2 0 0 0 0  Down, Depressed, Hopeless 0 0 0 0 0  PHQ - 2 Score 2 0 0 0 0  Altered sleeping 2 - - - -  Tired, decreased energy 3 - - - -  Change in appetite 3 - - - -  Feeling bad or failure about yourself  2 - - - -  Trouble concentrating 3 - - - -  Moving slowly or fidgety/restless 3 - - - -  Suicidal thoughts 0 - - - -  PHQ-9 Score 18 - - - -  Difficult doing work/chores Not difficult at all - - - -       Depression Severity and Treatment Recommendations:  0-4= None  5-9= Mild / Treatment: Support, educate to call if  worse; return in one month  10-14= Moderate / Treatment: Support, watchful waiting; Antidepressant or Psycotherapy  15-19= Moderately severe / Treatment: Antidepressant OR Psychotherapy  >= 20 = Major depression, severe / Antidepressant AND Psychotherapy  Functional Status Survey:   Is the patient deaf or have difficulty hearing?: No Does the patient have difficulty seeing, even when wearing glasses/contacts?: No(at times)  Does the patient have difficulty concentrating, remembering, or making decisions?: Yes Does the patient have difficulty walking or climbing stairs?: No Does the patient have difficulty dressing or bathing?: No Does the patient have difficulty doing errands alone such as visiting a doctor's office or shopping?: No   Advanced Care Planning: 1. Patient has executed an Advance Directive: No 2. If no, patient was given the opportunity to execute an Advance Directive today? No 3. Are the patient's advanced directives in [place]? Yes 4. This patient has the ability to prepare an Advance Directive: Yes 5. Provider is willing to follow the patient's wishes: No  Cognitive Assessment: Does the patient have evidence of cognitive impairment? No The patient does not have any evidence of any cognitive problems and denies any  change in mood/affect, appearance, speech, memory or motor skills.  Identification of Risk Factors: Risk factors include: hyperlipidemia and hypertension  Review of Systems  Constitutional: Negative for chills and fever.  Eyes: Negative for blurred vision and double vision.  Cardiovascular: Negative for chest pain and palpitations.  Gastrointestinal: Negative for abdominal pain, nausea and vomiting.  Genitourinary: Negative for dysuria, frequency and urgency.  Musculoskeletal: Negative for myalgias and neck pain.  Neurological: Negative for dizziness, tingling and headaches.  Psychiatric/Behavioral: Negative for depression. The patient is not  nervous/anxious.     Objective:   Vitals:   05/09/18 0810 05/09/18 0902  BP: (!) 175/96 (!) 146/80  Pulse: 97   Resp: 17   Temp: 98.2 F (36.8 C)   TempSrc: Oral   SpO2: 99%   Weight: 248 lb 6.4 oz (112.7 kg)   Height: 5' 6.93" (1.7 m)     Body mass index is 38.99 kg/m.  BP (!) 146/80   Pulse 97   Temp 98.2 F (36.8 C) (Oral)   Resp 17   Ht 5' 6.93" (1.7 m)   Wt 248 lb 6.4 oz (112.7 kg)   SpO2 99%   BMI 38.99 kg/m   General Appearance:    Alert, cooperative, no distress, appears stated age  Head:    Normocephalic, without obvious abnormality, atraumatic  Eyes:    PERRL, conjunctiva/corneas clear, EOM's intact, fundi    benign, both eyes  Ears:    Normal TM's and external ear canals, both ears  Nose:   Nares normal, septum midline, mucosa normal, no drainage    or sinus tenderness  Throat:   Lips, mucosa, and tongue normal; teeth and gums normal  Neck:   Supple, symmetrical, trachea midline, no adenopathy;    thyroid:  no enlargement/tenderness/nodules; no carotid   bruit or JVD  Back:     Symmetric, no curvature, ROM normal, no CVA tenderness  Lungs:     Clear to auscultation bilaterally, respirations unlabored  Chest Wall:    No tenderness or deformity   Heart:    Regular rate and rhythm, S1 and S2 normal, no murmur, rub   or gallop  Breast Exam:    No tenderness, masses, or nipple abnormality  Abdomen:     Soft, non-tender, bowel sounds active all four quadrants,    no masses, no organomegaly  Genitalia:    Deferred S/p hysterectomy  Extremities:   Extremities normal, atraumatic, no cyanosis or edema  Pulses:   2+ and symmetric all extremities  Skin:   Skin color, texture, turgor normal, no rashes or lesions  Lymph nodes:   Cervical, supraclavicular, and axillary nodes normal      Assessment/Plan:   Patient Self-Management and  Personalized Health Advice The patient has been provided with information about:  begin progressive daily aerobic exercise  program, follow a low fat, low cholesterol diet, attempt to lose weight, reduce salt in diet and cooking and continue current medications  During the course of the visit the patient was educated and counseled about appropriate screening and preventive services including:   lab testing as noted in orders section, medication refills are given     Body mass index is 38.99 kg/m. Discussed the patient's BMI with her. The BMI BMI is not in the acceptable range; BMI management plan is completed  Tammy [name] was seen today for annual exam.  Diagnoses and all orders for this visit:  Encounter for Medicare annual wellness exam  Flu vaccine need  Mixed dyslipidemia- will do crestor every other day -     Comprehensive metabolic panel -     Lipid panel  Family history of stroke or transient ischemic attack in sister Discussed risk factors and advised improved lipid control, bp control and aspirin with statin -     Comprehensive metabolic panel -     Lipid panel  Uncontrolled hypertension -  bp not at goal, discussed DASH diet -     Comprehensive metabolic panel -     Lipid panel  Other orders -     Flu vaccine HIGH DOSE PF (Fluzone High dose) -     rosuvastatin (CRESTOR) 20 MG tablet; Take 1 tablet (20 mg total) by mouth every other day.      Return in about 3 months (around 08/09/2018) for CHOLESTEROL CHECK .  No future appointments.  Patient Instructions   Tips for lowering your cholesterol:  Take your Crestor  Try [place] supplement Metamucil or Benefiber at LEAST once a day with plenty of water    If you have lab work done today you will be contacted with your lab results within the next 2 weeks.  If you have not heard from [place] then please contact us. The fastest way to get your results is to register for My Chart.   IF you received an x-ray today, you will receive an invoice from [facility]. Please contact [facility] at [phone] with questions or  concerns regarding your invoice.   IF you received labwork today, you will receive an invoice from [facility]. Please contact [facility] at [phone] with questions or concerns regarding your invoice.   Our billing staff will not be able to assist you with questions regarding bills from these companies.  You will be contacted with the lab results as soon as they are available. The fastest way to get your results is to activate your My Chart account. Instructions are located on the last page of this paperwork. If you have not heard from [place] regarding the results in 2 weeks, please contact this office.    Fat and Cholesterol Restricted Diet High levels of fat and cholesterol in your blood may lead to various health problems, such as diseases of the heart, blood vessels, gallbladder, liver, and pancreas. Fats are concentrated sources of energy that come in various forms. Certain types of fat, including saturated fat, may be harmful in excess. Cholesterol is a substance needed by your body in small amounts. Your body makes all the cholesterol it needs. Excess cholesterol comes from the food you eat. When you have high levels of cholesterol and saturated fat in your blood, health problems can develop because the excess fat and cholesterol will gather along the walls  of your blood vessels, causing them to narrow. Choosing the right foods will help you control your intake of fat and cholesterol. This will help keep the levels of these substances in your blood within normal limits and reduce your risk of disease. What is my plan? Your health care provider recommends that you:  Limit your fat intake to ______% or less of your total calories per day.  Limit the amount of cholesterol in your diet to less than _________mg per day.  Eat 20-30 grams of fiber each day.  What types of fat should I choose?  Choose healthy fats more often. Choose monounsaturated and polyunsaturated fats, such as olive and canola  oil, flaxseeds, walnuts, almonds, and seeds.  Eat more omega-3 fats. Good choices include salmon, mackerel, sardines, tuna, flaxseed oil, and ground flaxseeds. Aim to eat fish at least two times a week.  Limit saturated fats. Saturated fats are primarily found in animal products, such as meats, butter, and cream. Plant sources of saturated fats include palm oil, palm kernel oil, and coconut oil.  Avoid foods with partially hydrogenated oils in them. These contain trans fats. Examples of foods that contain trans fats are stick margarine, some tub margarines, cookies, crackers, and other baked goods. What general guidelines do I need to follow? These guidelines for healthy eating will help you control your intake of fat and cholesterol:  Check food labels carefully to identify foods with trans fats or high amounts of saturated fat.  Fill one half of your plate with vegetables and green salads.  Fill one fourth of your plate with whole grains. Look for the word "whole" as the first word in the ingredient list.  Fill one fourth of your plate with lean protein foods.  Limit fruit to two servings a day. Choose fruit instead of juice.  Eat more foods that contain fiber, such as apples, broccoli, carrots, beans, peas, and barley.  Eat more home-cooked food and less restaurant, buffet, and fast food.  Limit or avoid alcohol.  Limit foods high in starch and sugar.  Limit fried foods.  Cook foods using methods other than frying. Baking, boiling, grilling, and broiling are all great options.  Lose weight if you are overweight. Losing just 5-10% of your initial body weight can help your overall health and prevent diseases such as diabetes and heart disease.  What foods can I eat? Grains  Whole grains, such as whole wheat or whole grain breads, crackers, cereals, and pasta. Unsweetened oatmeal, bulgur, barley, quinoa, or brown rice. Corn or whole wheat flour tortillas. Vegetables  Fresh or  frozen vegetables (raw, steamed, roasted, or grilled). Green salads. Fruits  All fresh, canned (in natural juice), or frozen fruits. Meats and other protein foods  Ground beef (85% or leaner), grass-fed beef, or beef trimmed of fat. Skinless chicken or [place]. Ground chicken or [place]. Pork trimmed of fat. All fish and seafood. Eggs. Dried beans, peas, or lentils. Unsalted nuts or seeds. Unsalted canned or dry beans. Dairy  Low-fat dairy products, such as skim or 1% milk, 2% or reduced-fat cheeses, low-fat ricotta or cottage cheese, or plain low-fat yo Fats and oils  Tub margarines without trans fats. Light or reduced-fat mayonnaise and salad dressings. Avocado. Olive, canola, sesame, or safflower oils. Natural peanut or almond butter (choose ones without added sugar and oil). The items listed above may not be a complete list of recommended foods or beverages. Contact your dietitian for more options. Foods to avoid [employer]  bread. White pasta. White rice. Cornbread. Bagels, pastries, and croissants. Crackers that contain trans fat. Vegetables  White potatoes. Corn. Creamed or fried vegetables. Vegetables in a cheese sauce. Fruits  Dried fruits. Canned fruit in light or heavy syrup. Fruit juice. Meats and other protein foods  Fatty cuts of meat. Ribs, chicken wings, bacon, sausage, bologna, salami, chitterlings, fatback, hot dogs, bratwurst, and packaged luncheon meats. Liver and organ meats. Dairy  Whole or 2% milk, cream, half-and-half, and cream cheese. Whole milk cheeses. Whole-fat or sweetened yogurt. Full-fat cheeses. Nondairy creamers and whipped toppings. Processed cheese, cheese spreads, or cheese curds. Beverages  Alcohol. Sweetened drinks (such as sodas, lemonade, and fruit drinks or punches). Fats and oils  Butter, stick margarine, lard, shortening, ghee, or bacon fat. Coconut, palm kernel, or palm oils. Sweets and desserts  Corn syrup, sugars, honey, and  molasses. Candy. Jam and jelly. Syrup. Sweetened cereals. Cookies, pies, cakes, donuts, muffins, and ice cream. The items listed above may not be a complete list of foods and beverages to avoid. Contact your dietitian for more information. This information is not intended to replace advice given to you by your health care provider. Make sure you discuss any questions you have with your health care provider. Document Released: 07/12/2005 Document Revised: 08/02/2014 Document Reviewed: 10/10/2013 Elsevier Interactive Patient Education  [employer].    An after visit summary with all of these plans was given to the patient.

## 2018-05-09 NOTE — Patient Instructions (Addendum)
Tips for lowering your cholesterol:  Take your Crestor  Try Garlique supplement Metamucil or Benefiber at LEAST once a day with plenty of water    If you have lab work done today you will be contacted with your lab results within the next 2 weeks.  If you have not heard from [place] then please contact us. The fastest way to get your results is to register for My Chart.   IF you received an x-ray today, you will receive an invoice from [facility]. Please contact [facility] at [phone] with questions or concerns regarding your invoice.   IF you received labwork today, you will receive an invoice from [facility]. Please contact [facility] at [phone] with questions or concerns regarding your invoice.   Our billing staff will not be able to assist you with questions regarding bills from these companies.  You will be contacted with the lab results as soon as they are available. The fastest way to get your results is to activate your My Chart account. Instructions are located on the last page of this paperwork. If you have not heard from [place] regarding the results in 2 weeks, please contact this office.    Fat and Cholesterol Restricted Diet High levels of fat and cholesterol in your blood may lead to various health problems, such as diseases of the heart, blood vessels, gallbladder, liver, and pancreas. Fats are concentrated sources of energy that come in various forms. Certain types of fat, including saturated fat, may be harmful in excess. Cholesterol is a substance needed by your body in small amounts. Your body makes all the cholesterol it needs. Excess cholesterol comes from the food you eat. When you have high levels of cholesterol and saturated fat in your blood, health problems can develop because the excess fat and cholesterol will gather along the walls of your blood vessels, causing them to narrow. Choosing the right foods will help you control your intake of fat and  cholesterol. This will help keep the levels of these substances in your blood within normal limits and reduce your risk of disease. What is my plan? Your health care provider recommends that you:  Limit your fat intake to ______% or less of your total calories per day.  Limit the amount of cholesterol in your diet to less than _________mg per day.  Eat 20-30 grams of fiber each day.  What types of fat should I choose?  Choose healthy fats more often. Choose monounsaturated and polyunsaturated fats, such as olive and canola oil, flaxseeds, walnuts, almonds, and seeds.  Eat more omega-3 fats. Good choices include salmon, mackerel, sardines, tuna, flaxseed oil, and ground flaxseeds. Aim to eat fish at least two times a week.  Limit saturated fats. Saturated fats are primarily found in animal products, such as meats, butter, and cream. Plant sources of saturated fats include palm oil, palm kernel oil, and coconut oil.  Avoid foods with partially hydrogenated oils in them. These contain trans fats. Examples of foods that contain trans fats are stick margarine, some tub margarines, cookies, crackers, and other baked goods. What general guidelines do I need to follow? These guidelines for healthy eating will help you control your intake of fat and cholesterol:  Check food labels carefully to identify foods with trans fats or high amounts of saturated fat.  Fill one half of your plate with vegetables and green salads.  Fill one fourth of your plate with whole grains. Look for the word "whole" as the first  word in the ingredient list.  Fill one fourth of your plate with lean protein foods.  Limit fruit to two servings a day. Choose fruit instead of juice.  Eat more foods that contain fiber, such as apples, broccoli, carrots, beans, peas, and barley.  Eat more home-cooked food and less restaurant, buffet, and fast food.  Limit or avoid alcohol.  Limit foods high in starch and  sugar.  Limit fried foods.  Cook foods using methods other than frying. Baking, boiling, grilling, and broiling are all great options.  Lose weight if you are overweight. Losing just 5-10% of your initial body weight can help your overall health and prevent diseases such as diabetes and heart disease.  What foods can I eat? Grains  Whole grains, such as whole wheat or whole grain breads, crackers, cereals, and pasta. Unsweetened oatmeal, bulgur, barley, quinoa, or brown rice. Corn or whole wheat flour tortillas. Vegetables  Fresh or frozen vegetables (raw, steamed, roasted, or grilled). Green salads. Fruits  All fresh, canned (in natural juice), or frozen fruits. Meats and other protein foods  Ground beef (85% or leaner), grass-fed beef, or beef trimmed of fat. Skinless chicken or [place]. Ground chicken or [place]. Pork trimmed of fat. All fish and seafood. Eggs. Dried beans, peas, or lentils. Unsalted nuts or seeds. Unsalted canned or dry beans. Dairy  Low-fat dairy products, such as skim or 1% milk, 2% or reduced-fat cheeses, low-fat ricotta or cottage cheese, or plain low-fat yo Fats and oils  Tub margarines without trans fats. Light or reduced-fat mayonnaise and salad dressings. Avocado. Olive, canola, sesame, or safflower oils. Natural peanut or almond butter (choose ones without added sugar and oil). The items listed above may not be a complete list of recommended foods or beverages. Contact your dietitian for more options. Foods to avoid Grains  White bread. White pasta. White rice. Cornbread. Bagels, pastries, and croissants. Crackers that contain trans fat. Vegetables  White potatoes. Corn. Creamed or fried vegetables. Vegetables in a cheese sauce. Fruits  Dried fruits. Canned fruit in light or heavy syrup. Fruit juice. Meats and other protein foods  Fatty cuts of meat. Ribs, chicken wings, bacon, sausage, bologna, salami, chitterlings, fatback, hot dogs, bratwurst,  and packaged luncheon meats. Liver and organ meats. Dairy  Whole or 2% milk, cream, half-and-half, and cream cheese. Whole milk cheeses. Whole-fat or sweetened yogurt. Full-fat cheeses. Nondairy creamers and whipped toppings. Processed cheese, cheese spreads, or cheese curds. Beverages  Alcohol. Sweetened drinks (such as sodas, lemonade, and fruit drinks or punches). Fats and oils  Butter, stick margarine, lard, shortening, ghee, or bacon fat. Coconut, palm kernel, or palm oils. Sweets and desserts  Corn syrup, sugars, honey, and molasses. Candy. Jam and jelly. Syrup. Sweetened cereals. Cookies, pies, cakes, donuts, muffins, and ice cream. The items listed above may not be a complete list of foods and beverages to avoid. Contact your dietitian for more information. This information is not intended to replace advice given to you by your health care provider. Make sure you discuss any questions you have with your health care provider. Document Released: 07/12/2005 Document Revised: 08/02/2014 Document Reviewed: 10/10/2013 Elsevier Interactive Patient Education  [employer].

## 2018-05-10 NOTE — Addendum Note (Signed)
Addended by: Collie Siad A on: 05/10/2018 10:13 AM   Modules accepted: Orders

## 2018-08-01 ENCOUNTER — Other Ambulatory Visit: Payer: Self-pay | Admitting: Family Medicine

## 2018-08-15 DIAGNOSIS — H524 Presbyopia: Secondary | ICD-10-CM | POA: Diagnosis not present

## 2018-09-14 ENCOUNTER — Encounter: Payer: Self-pay | Admitting: Family Medicine

## 2018-09-14 ENCOUNTER — Other Ambulatory Visit: Payer: Self-pay

## 2018-09-14 ENCOUNTER — Ambulatory Visit (INDEPENDENT_AMBULATORY_CARE_PROVIDER_SITE_OTHER): Payer: Medicare HMO | Admitting: Family Medicine

## 2018-09-14 VITALS — BP 138/83 | HR 73 | Temp 99.9°F | Resp 17 | Ht 66.93 in | Wt 252.4 lb

## 2018-09-14 DIAGNOSIS — E785 Hyperlipidemia, unspecified: Secondary | ICD-10-CM

## 2018-09-14 DIAGNOSIS — I10 Essential (primary) hypertension: Secondary | ICD-10-CM | POA: Diagnosis not present

## 2018-09-14 DIAGNOSIS — R51 Headache: Secondary | ICD-10-CM

## 2018-09-14 DIAGNOSIS — R519 Headache, unspecified: Secondary | ICD-10-CM

## 2018-09-14 MED ORDER — GUAIFENESIN ER 600 MG PO TB12: 1200.0000 mg | ORAL_TABLET | Freq: Two times a day (BID) | ORAL | 1 refills | Status: DC

## 2018-09-14 MED ORDER — SUMATRIPTAN SUCCINATE 50 MG PO TABS
50.0000 mg | ORAL_TABLET | ORAL | 0 refills | Status: AC | PRN
Start: 2018-09-14 — End: ?

## 2018-09-14 MED ORDER — FLUTICASONE PROPIONATE 50 MCG/ACT NA SUSP: 2.0000 | Freq: Every day | NASAL | 6 refills | Status: DC

## 2018-09-14 MED ORDER — BENZONATATE 100 MG PO CAPS: 100.0000 mg | ORAL_CAPSULE | Freq: Two times a day (BID) | ORAL | 0 refills | Status: DC | PRN

## 2018-09-14 NOTE — Patient Instructions (Addendum)
If you have lab work done today you will be contacted with your lab results within the next 2 weeks.  If you have not heard from [place] then please contact us. The fastest way to get your results is to register for My Chart.   IF you received an x-ray today, you will receive an invoice from [facility]. Please contact [facility] at [phone] with questions or concerns regarding your invoice.   IF you received labwork today, you will receive an invoice from [facility]. Please contact [facility] at [phone] with questions or concerns regarding your invoice.   Our billing staff will not be able to assist you with questions regarding bills from these companies.  You will be contacted with the lab results as soon as they are available. The fastest way to get your results is to activate your My Chart account. Instructions are located on the last page of this paperwork. If you have not heard from [place] regarding the results in 2 weeks, please contact this office.     Sinus Headache  A sinus headache occurs when your sinuses become clogged or swollen. Sinuses are air-filled spaces in your skull that are behind the bones of your face and forehead. Sinus headaches can range from mild to severe. What are the causes? A sinus headache can result from various conditions that affect the sinuses. Common causes include:  Colds.  Sinus infections.  Allergies. Many people confuse sinus headaches with migraines or tension headaches because those headaches can also cause facial pain and nasal symptoms. What are the signs or symptoms? The main symptom of this condition is a headache that may feel like pain or pressure in your face, forehead, ears, or upper teeth. People who have a sinus headache often have other symptoms, such as:  Congested or runny nose.  Fever.  Inability to smell. Weather changes can make symptoms worse. How is this diagnosed? This condition may be diagnosed  based on:  A physical exam and medical history.  Imaging tests, such as a CT scan or MRI, to check for problems with the sinuses.  Examination of the sinuses using a thin tool with a camera that is inserted through your nose (endoscopy). How is this treated? Treatment for this condition depends on the cause.  Sinus pain that is caused by a sinus infection may be treated with antibiotic medicine.  Sinus pain that is caused by allergies may be helped by allergy medicines (antihistamines) and medicated nasal sprays.  Sinus pain that is caused by congestion may be helped by rinsing out (flushing) the nose and sinuses with saline solution.  Sinus surgery may be needed in some cases if other treatments do not help. Follow these instructions at home: General instructions  If directed: ? Apply a warm, moist washcloth to your face to help relieve pain. ? Use a nasal saline wash. Medicines   Take over-the-counter and prescription medicines only as told by your health care provider.  If you were prescribed an antibiotic medicine, take it as told by your health care provider. Do not stop taking the antibiotic even if you start to feel better.  If you have congestion, use a nasal spray to help lessen pressure. Hydrate and humidify  Drink enough water to keep your urine clear or pale yellow. Staying hydrated will help to thin your mucus.  Use a cool mist humidifier to keep the humidity level in your home above 50%.  Inhale steam for 10-15 minutes, 3-4  times a day or as told by your health care provider. You can do this in the bathroom while a hot shower is running.  Limit your exposure to cool or dry air. Contact a health care provider if:  You have a headache more than one time a week.  You have sensitivity to light or sound.  You develop a fever.  You feel nauseous or you vomit.  Your headaches do not get better with treatment. Many people think that they have a sinus headache  when they actually have a migraine or a tension headache. Get help right away if:  You have vision problems.  You have sudden, severe pain in your face or head.  You have a seizure.  You are confused.  You have a stiff neck. Summary  A sinus headache occurs when your sinuses become clogged or swollen.  A sinus headache can result from various conditions that affect the sinuses, such as a cold, a sinus infection, or an allergy.  Treatment for this condition depends on the cause. It may include medicine, such as antibiotics or antihistamines. This information is not intended to replace advice given to you by your health care provider. Make sure you discuss any questions you have with your health care provider. Document Released: 08/19/2004 Document Revised: 04/22/2017 Document Reviewed: 04/22/2017 Elsevier Interactive Patient Education  2019 [employer].

## 2018-09-14 NOTE — Progress Notes (Signed)
Established Patient Office Visit  Subjective:  Patient ID: Tammy [name], female    DOB: July 05, 1946  Age: 73 y.o. MRN: [id number]  CC:  Chief Complaint  Patient presents with  . medication follow up    per pt the metoprolol is not lowering bp's, per pt bp of 197/105 last night and this morning bp of 180's/105 and believes the ha's are due to med not lowering bp  . severe headaches    ha's started after taking crestor, having constipation, taking miralax twice a week for constipation.  Says the med causing constipation for her.    HPI ARIAS BARMES presents for   Headache around the eyes and in the back of the head Worse if she cough  The headache was aggravated by her cough overnight Her headache is a dull ache She woke up and took an extra metoprolol overnight She states that the headache feels like a pressure on the inside of her head trying to come out Onset: intermittent for a week Yesterday it was dramatically worse She took some cold medication and went to bed Alka seltzer plus cold and sinus and cough She reports that the cold medication that she took was just last night.   Hypertension: Patient here for follow-up of elevated blood pressure. She is not exercising and is adherent to low salt diet.  Blood pressure is well controlled at home. Cardiac symptoms none. Patient denies chest pain, claudication, exertional chest pressure/discomfort, irregular heart beat and lower extremity edema.  Cardiovascular risk factors: hypertension. Use of agents associated with hypertension: none. History of target organ damage: none. BP Readings from Last 3 Encounters:  09/14/18 138/83  05/09/18 (!) 146/80  09/01/17 (!) 145/88   Lab Results  Component Value Date   CREATININE 1.16 (H) 09/14/2018      Past Medical History:  Diagnosis Date  . Colon polyps   . Hypertension     Past Surgical History:  Procedure Laterality Date  . ABDOMINAL HYSTERECTOMY  1982  . BUNIONECTOMY  Bilateral     Family History  Problem Relation Age of Onset  . Diabetes Mother   . Diabetes Sister   . Diabetes Brother   . Diabetes Sister     Social History   Socioeconomic History  . Marital status: Divorced    Spouse name: Not on file  . Number of children: 2  . Years of education: Not on file  . Highest education level: Not on file  Occupational History  . Occupation: retired  Engineer, production  . Financial resource strain: Not on file  . Food insecurity:    Worry: Not on file    Inability: Not on file  . Transportation needs:    Medical: Not on file    Non-medical: Not on file  Tobacco Use  . Smoking status: Former Smoker    Years: 3.00    Last attempt to quit: 07/26/1973    Years since quitting: 45.2  . Smokeless tobacco: Never Used  Substance and Sexual Activity  . Alcohol use: No  . Drug use: No  . Sexual activity: Not on file  Lifestyle  . Physical activity:    Days per week: Not on file    Minutes per session: Not on file  . Stress: Not on file  Relationships  . Social connections:    Talks on phone: Not on file    Gets together: Not on file    Attends religious service: Not on file  Active member of club or organization: Not on file    Attends meetings of clubs or organizations: Not on file    Relationship status: Not on file  . Intimate partner violence:    Fear of current or ex partner: Not on file    Emotionally abused: Not on file    Physically abused: Not on file    Forced sexual activity: Not on file  Other Topics Concern  . Not on file  Social History Narrative  . Not on file    Outpatient Medications Prior to Visit  Medication Sig Dispense Refill  . aspirin EC 81 MG tablet Take 1 tablet (81 mg total) by mouth daily. 90 tablet 3  . Cholecalciferol (VITAMIN D3) 400 units CAPS Take by mouth.    . folic acid (FOLVITE) 800 MCG tablet Take 400 mcg by mouth daily.    . furosemide (LASIX) 20 MG tablet TAKE 1 TABLET EVERY DAY 90 tablet 0  .  ipratropium (ATROVENT) 0.03 % nasal spray Place 2 sprays into both nostrils 2 (two) times daily. 30 mL 0  . metoprolol tartrate (LOPRESSOR) 25 MG tablet TAKE 1 TABLET EVERY DAY 90 tablet 0  . Multiple Vitamins-Minerals (ALIVE ONCE DAILY WOMENS PO) Take 1 tablet by mouth daily.    . Omega-3 1000 MG CAPS Take by mouth.    . rosuvastatin (CRESTOR) 20 MG tablet Take 1 tablet (20 mg total) by mouth every other day. 45 tablet 0  . vitamin E 400 UNIT capsule Take 400 Units by mouth daily.     No facility-administered medications prior to visit.     Allergies  Allergen Reactions  . Neurontin [Gabapentin] Itching    ROS Review of Systems    Objective:    Physical Exam  BP 138/83 (BP Location: Right Arm, Patient Position: Sitting, Cuff Size: Large)   Pulse 73   Temp 99.9 F (37.7 C) (Oral)   Resp 17   Ht 5' 6.93" (1.7 m)   Wt 252 lb 6.4 oz (114.5 kg)   SpO2 99%   BMI 39.61 kg/m  Wt Readings from Last 3 Encounters:  09/14/18 252 lb 6.4 oz (114.5 kg)  05/09/18 248 lb 6.4 oz (112.7 kg)  09/01/17 227 lb 12.8 oz (103.3 kg)   Physical Exam  Constitutional: Oriented to person, place, and time. Appears well-developed and well-nourished.  HENT:  Head: Normocephalic and atraumatic.  Eyes: Conjunctivae and EOM are normal.  Cardiovascular: Normal rate, regular rhythm, normal heart sounds and intact distal pulses.  No murmur heard. Pulmonary/Chest: Effort normal and breath sounds normal. No stridor. No respiratory distress. Has no wheezes.  Neurological: Is alert and oriented to person, place, and time.  Skin: Skin is warm. Capillary refill takes less than 2 seconds.  Psychiatric: Has a normal mood and affect. Behavior is normal. Judgment and thought content normal.   There are no preventive care reminders to display for this patient.  There are no preventive care reminders to display for this patient.  Lab Results  Component Value Date   TSH 0.852 Test methodology is 3rd generation  TSH 12/25/2007   Lab Results  Component Value Date   WBC 5.3 12/15/2016   HGB 11.1 (A) 12/15/2016   HCT 33.3 (A) 12/15/2016   MCV 84.8 12/15/2016   PLT 214 02/10/2008   Lab Results  Component Value Date   NA 136 09/14/2018   K 4.6 09/14/2018   CO2 25 09/14/2018   GLUCOSE 93 09/14/2018   BUN  10 09/14/2018   CREATININE 1.16 (H) 09/14/2018   BILITOT 0.3 09/14/2018   ALKPHOS 93 09/14/2018   AST 18 09/14/2018   ALT 16 09/14/2018   PROT 7.8 09/14/2018   ALBUMIN 4.4 09/14/2018   CALCIUM 9.5 09/14/2018   GFR 98.15 02/01/2017   Lab Results  Component Value Date   CHOL 187 09/14/2018   Lab Results  Component Value Date   HDL 53 09/14/2018   Lab Results  Component Value Date   LDLCALC 119 (H) 09/14/2018   Lab Results  Component Value Date   TRIG 73 09/14/2018   Lab Results  Component Value Date   CHOLHDL 3.5 09/14/2018   No results found for: HGBA1C    Assessment & Plan:   Problem List Items Addressed This Visit    None    Visit Diagnoses    Essential hypertension    -  Primary   Relevant Orders   Comprehensive metabolic panel (Completed)   Lipid panel (Completed)   Sinus headache       Relevant Medications   SUMAtriptan (IMITREX) 50 MG tablet   Dyslipidemia         Headache consist with sinus headache Blood pressure previously very well controlled with furosemide and metoprolol Continue current bp medications Will treat for headache with imitrex, tessalon perles and flonase Advised pt to avoid decongestants over the counter as they raise the blood pressure  Checked cmp and lipid today for chronic disease mgmt  Pt to continue her current meds Education provided for warning signs of bad headache   Meds ordered this encounter  Medications  . SUMAtriptan (IMITREX) 50 MG tablet    Sig: Take 1 tablet (50 mg total) by mouth every 2 (two) hours as needed for migraine. May repeat in 2 hours if headache persists or recurs.    Dispense:  10 tablet     Refill:  0  . fluticasone (FLONASE) 50 MCG/ACT nasal spray    Sig: Place 2 sprays into both nostrils daily.    Dispense:  16 g    Refill:  6  . guaiFENesin (MUCINEX) 600 MG 12 hr tablet    Sig: Take 2 tablets (1,200 mg total) by mouth 2 (two) times daily.    Dispense:  60 tablet    Refill:  1  . benzonatate (TESSALON) 100 MG capsule    Sig: Take 1 capsule (100 mg total) by mouth 2 (two) times daily as needed for cough.    Dispense:  20 capsule    Refill:  0    Follow-up: Return in about 4 weeks (around 10/12/2018) for headache follow up.    Doristine Bosworth, MD

## 2018-09-15 LAB — COMPREHENSIVE METABOLIC PANEL
ALT: 16 IU/L (ref 0–32)
AST: 18 IU/L (ref 0–40)
Albumin/Globulin Ratio: 1.3 (ref 1.2–2.2)
Albumin: 4.4 g/dL (ref 3.7–4.7)
Alkaline Phosphatase: 93 IU/L (ref 39–117)
BUN/Creatinine Ratio: 9 — ABNORMAL LOW (ref 12–28)
BUN: 10 mg/dL (ref 8–27)
Bilirubin Total: 0.3 mg/dL (ref 0.0–1.2)
CO2: 25 mmol/L (ref 20–29)
Calcium: 9.5 mg/dL (ref 8.7–10.3)
Chloride: 96 mmol/L (ref 96–106)
Creatinine, Ser: 1.16 mg/dL — ABNORMAL HIGH (ref 0.57–1.00)
GFR calc Af Amer: 55 mL/min/{1.73_m2} — ABNORMAL LOW (ref >59)
GFR calc non Af Amer: 47 mL/min/{1.73_m2} — ABNORMAL LOW (ref >59)
Globulin, Total: 3.4 g/dL (ref 1.5–4.5)
Glucose: 93 mg/dL (ref 65–99)
Potassium: 4.6 mmol/L (ref 3.5–5.2)
Sodium: 136 mmol/L (ref 134–144)
Total Protein: 7.8 g/dL (ref 6.0–8.5)

## 2018-09-15 LAB — LIPID PANEL
Chol/HDL Ratio: 3.5 ratio (ref 0.0–4.4)
Cholesterol, Total: 187 mg/dL (ref 100–199)
HDL: 53 mg/dL (ref >39)
LDL Calculated: 119 mg/dL — ABNORMAL HIGH (ref 0–99)
Triglycerides: 73 mg/dL (ref 0–149)
VLDL Cholesterol Cal: 15 mg/dL (ref 5–40)

## 2018-09-19 ENCOUNTER — Encounter: Payer: Self-pay | Admitting: Radiology

## 2018-10-24 ENCOUNTER — Telehealth: Payer: Medicare HMO | Admitting: Family Medicine

## 2018-10-24 ENCOUNTER — Ambulatory Visit: Payer: Medicare HMO | Admitting: Family Medicine

## 2018-10-25 ENCOUNTER — Other Ambulatory Visit: Payer: Self-pay | Admitting: Family Medicine

## 2018-10-25 ENCOUNTER — Other Ambulatory Visit: Payer: Self-pay

## 2018-10-25 ENCOUNTER — Telehealth: Payer: Self-pay | Admitting: Family Medicine

## 2018-10-25 MED ORDER — GUAIFENESIN ER 600 MG PO TB12: 1200.0000 mg | ORAL_TABLET | Freq: Two times a day (BID) | ORAL | 1 refills | Status: DC

## 2018-10-25 NOTE — Telephone Encounter (Signed)
Patient left voicemail message requesting a refill of    guaiFENesin (MUCINEX) 600 MG 12 hr tablet        to be sent to [facility] pharmacy. Pt states that prescription could also be faxed to [phone]

## 2018-10-25 NOTE — Telephone Encounter (Signed)
Rx sent in

## 2018-10-25 NOTE — Telephone Encounter (Signed)
Requested Prescriptions  Pending Prescriptions Disp Refills  . metoprolol tartrate (LOPRESSOR) 25 MG tablet [Pharmacy Med Name: METOPROLOL TARTRATE 25 MG Tablet] 90 tablet 1    Sig: TAKE 1 TABLET EVERY DAY     Cardiovascular:  Beta Blockers Passed - 10/25/2018  2:38 PM      Passed - Last BP in normal range    BP Readings from Last 1 Encounters:  09/14/18 138/83         Passed - Last Heart Rate in normal range    Pulse Readings from Last 1 Encounters:  09/14/18 73         Passed - Valid encounter within last 6 months    Recent Outpatient Visits          1 month ago Essential hypertension   Primary Care at [facility], Manus Rudd, MD   5 months ago Encounter for Medicare annual wellness exam   Primary Care at [facility], Manus Rudd, MD   1 year ago External hemorrhoids   Primary Care at [facility], Manus Rudd, MD   1 year ago Cough   Primary Care at [place], [place] D, PA-C   1 year ago Need for immunization against influenza   Primary Care at [facility], Myrle Sheng, MD           . furosemide (LASIX) 20 MG tablet [Pharmacy Med Name: FUROSEMIDE 20 MG Tablet] 90 tablet 1    Sig: TAKE 1 TABLET EVERY DAY     Cardiovascular:  Diuretics - Loop Failed - 10/25/2018  2:38 PM      Failed - Cr in normal range and within 360 days    Creatinine, Ser  Date Value Ref Range Status  09/14/2018 1.16 (H) 0.57 - 1.00 mg/dL Final         Passed - K in normal range and within 360 days    Potassium  Date Value Ref Range Status  09/14/2018 4.6 3.5 - 5.2 mmol/L Final         Passed - Ca in normal range and within 360 days    Calcium  Date Value Ref Range Status  09/14/2018 9.5 8.7 - 10.3 mg/dL Final         Passed - Na in normal range and within 360 days    Sodium  Date Value Ref Range Status  09/14/2018 136 134 - 144 mmol/L Final         Passed - Last BP in normal range    BP Readings from Last 1 Encounters:  09/14/18 138/83         Passed - Valid encounter within  last 6 months    Recent Outpatient Visits          1 month ago Essential hypertension   Primary Care at [facility], Manus Rudd, MD   5 months ago Encounter for Medicare annual wellness exam   Primary Care at [facility], Manus Rudd, MD   1 year ago External hemorrhoids   Primary Care at [facility], Manus Rudd, MD   1 year ago Cough   Primary Care at [place], [place] D, PA-C   1 year ago Need for immunization against influenza   Primary Care at [facility], Myrle Sheng, MD

## 2019-03-21 ENCOUNTER — Other Ambulatory Visit: Payer: Self-pay | Admitting: Family Medicine

## 2019-03-21 NOTE — Telephone Encounter (Signed)
Requested medication (s) are due for refill today: yes  Requested medication (s) are on the active medication list: yes  Last refill:  10/25/2018  Future visit scheduled: no  Notes to clinic:  Review for refill   Requested Prescriptions  Pending Prescriptions Disp Refills   metoprolol tartrate (LOPRESSOR) 25 MG tablet [Pharmacy Med Name: METOPROLOL TARTRATE 25 MG Tablet] 90 tablet 1    Sig: TAKE 1 TABLET EVERY DAY     Cardiovascular:  Beta Blockers Failed - 03/21/2019  1:39 AM      Failed - Valid encounter within last 6 months    Recent Outpatient Visits          6 months ago Essential hypertension   Primary Care at [facility], Arlie Solomons, MD   10 months ago Encounter for [employer] annual wellness exam   Primary Care at [facility], Arlie Solomons, MD   1 year ago External hemorrhoids   Primary Care at [place], MD   1 year ago Cough   Primary Care at [place], [place] D, PA-C   1 year ago Need for immunization against influenza   Primary Care at [facility], Renette Butters, MD             Passed - Last BP in normal range    BP Readings from Last 1 Encounters:  09/14/18 138/83         Passed - Last Heart Rate in normal range    Pulse Readings from Last 1 Encounters:  09/14/18 73          furosemide (LASIX) 20 MG tablet [Pharmacy Med Name: FUROSEMIDE 20 MG Tablet] 90 tablet 1    Sig: TAKE 1 TABLET EVERY DAY     Cardiovascular:  Diuretics - Loop Failed - 03/21/2019  1:39 AM      Failed - Cr in normal range and within 360 days    Creatinine, Ser  Date Value Ref Range Status  09/14/2018 1.16 (H) 0.57 - 1.00 mg/dL Final         Failed - Valid encounter within last 6 months    Recent Outpatient Visits          6 months ago Essential hypertension   Primary Care at [place], MD   10 months ago Encounter for [employer] annual wellness exam   Primary Care at [place], MD   1 year ago External hemorrhoids   Primary Care at  [place], MD   1 year ago Cough   Primary Care at [place], [place] D, PA-C   1 year ago Need for immunization against influenza   Primary Care at [facility], Renette Butters, MD             Passed - K in normal range and within 360 days    Potassium  Date Value Ref Range Status  09/14/2018 4.6 3.5 - 5.2 mmol/L Final         Passed - Ca in normal range and within 360 days    Calcium  Date Value Ref Range Status  09/14/2018 9.5 8.7 - 10.3 mg/dL Final         Passed - Na in normal range and within 360 days    Sodium  Date Value Ref Range Status  09/14/2018 136 134 - 144 mmol/L Final         Passed - Last BP in normal range    BP  Readings from Last 1 Encounters:  09/14/18 138/83

## 2019-03-27 ENCOUNTER — Other Ambulatory Visit: Payer: Self-pay | Admitting: Family Medicine

## 2019-03-27 DIAGNOSIS — Z1231 Encounter for screening mammogram for malignant neoplasm of breast: Secondary | ICD-10-CM

## 2019-05-15 ENCOUNTER — Ambulatory Visit
Admission: RE | Admit: 2019-05-15 | Discharge: 2019-05-15 | Disposition: A | Discharge status: Home / Self Care | Payer: Medicare HMO | Source: Ambulatory Visit | Attending: Family Medicine | Admitting: Family Medicine

## 2019-05-15 ENCOUNTER — Other Ambulatory Visit: Payer: Self-pay

## 2019-05-15 DIAGNOSIS — Z1231 Encounter for screening mammogram for malignant neoplasm of breast: Secondary | ICD-10-CM | POA: Diagnosis not present

## 2019-06-19 ENCOUNTER — Other Ambulatory Visit: Payer: Self-pay

## 2019-06-19 ENCOUNTER — Encounter: Payer: Self-pay | Admitting: Family Medicine

## 2019-06-19 ENCOUNTER — Ambulatory Visit: Payer: Self-pay

## 2019-06-19 ENCOUNTER — Ambulatory Visit (INDEPENDENT_AMBULATORY_CARE_PROVIDER_SITE_OTHER): Payer: Medicare HMO | Admitting: Family Medicine

## 2019-06-19 VITALS — BP 138/84 | HR 91 | Temp 98.4°F | Ht 66.0 in | Wt 267.2 lb

## 2019-06-19 DIAGNOSIS — Z23 Encounter for immunization: Secondary | ICD-10-CM | POA: Diagnosis not present

## 2019-06-19 DIAGNOSIS — I10 Essential (primary) hypertension: Secondary | ICD-10-CM

## 2019-06-19 DIAGNOSIS — Z789 Other specified health status: Secondary | ICD-10-CM

## 2019-06-19 DIAGNOSIS — Z6841 Body Mass Index (BMI) 40.0 and over, adult: Secondary | ICD-10-CM | POA: Diagnosis not present

## 2019-06-19 DIAGNOSIS — E785 Hyperlipidemia, unspecified: Secondary | ICD-10-CM | POA: Diagnosis not present

## 2019-06-19 DIAGNOSIS — Z0001 Encounter for general adult medical examination with abnormal findings: Secondary | ICD-10-CM | POA: Diagnosis not present

## 2019-06-19 DIAGNOSIS — Z03818 Encounter for observation for suspected exposure to other biological agents ruled out: Secondary | ICD-10-CM | POA: Diagnosis not present

## 2019-06-19 DIAGNOSIS — Z0000 Encounter for general adult medical examination without abnormal findings: Secondary | ICD-10-CM

## 2019-06-19 DIAGNOSIS — Z9189 Other specified personal risk factors, not elsewhere classified: Secondary | ICD-10-CM | POA: Diagnosis not present

## 2019-06-19 NOTE — Patient Instructions (Addendum)
If you have lab work done today you will be contacted with your lab results within the next 2 weeks.  If you have not heard from [place] then please contact us. The fastest way to get your results is to register for My Chart.   IF you received an x-ray today, you will receive an invoice from [facility]. Please contact [facility] at [phone] with questions or concerns regarding your invoice.   IF you received labwork today, you will receive an invoice from [place]. Please contact [facility] at [phone] with questions or concerns regarding your invoice.   Our billing staff will not be able to assist you with questions regarding bills from these companies.  You will be contacted with the lab results as soon as they are available. The fastest way to get your results is to activate your My Chart account. Instructions are located on the last page of this paperwork. If you have not heard from [place] regarding the results in 2 weeks, please contact this office.      Magnesium Test Why am I having this test? A magnesium test is done to determine how much magnesium you have in your blood. You may have this test if you:  Are a pregnant woman and you have signs of high blood pressure (preeclampsia).  Have diabetes (diabetes mellitus).  Have a history of alcohol abuse.  Have recently had surgery and are unable to eat normally.  Have a disorder of the thyroid or parathyroid gland.  Use antacid medicines frequently.  Have long-term (chronic) kidney disease.  Have levels of potassium and calcium that are regularly (chronically) low.  Lack certain nutrients in your diet (malnourishment). What is being tested? This test measures the amount of magnesium in your blood. Magnesium is a mineral that helps with many processes in the body, including nerve and heart function. It helps regulate your heartbeat and blood pressure. Your potassium and calcium levels may also be tested  because blood magnesium levels are closely related to the levels of these other minerals. What kind of sample is taken?  A blood sample is required for this test. It is usually collected by inserting a needle into a blood vessel. How are the results reported? Your test results will be reported as a value that indicates how much magnesium is in your blood. Your health care provider will compare your results to normal ranges that were established after testing a large group of people (reference ranges). Reference ranges may vary among labs and hospitals. For this test, common normal reference ranges are:  Adult: 1.3-2.1 mEq/L or 0.65-1.05 mmol/L (SI units).  Child: 1.4-1.7 mEq/L.  Newborn: 1.4-2 mEq/L. What do the results mean? Results that are higher than your reference range mean that you have too much magnesium in your blood, which may result from:  Poor kidney function (renal insufficiency).  Addison's disease, also called primary adrenal insufficiency.  Excess use of antacid medicines.  The thyroid gland not producing enough thyroid hormone (hypothyroidism). Results that are lower than your reference range mean that you have too little magnesium in your blood, which may result from:  Not eating enough nutrients (malnutrition).  Your intestines not absorbing nutrients normally.  The parathyroid glands not producing enough hormones (hypoparathyroidism).  Alcohol abuse.  Chronic kidney disease.  Poorly-controlled diabetes (diabetic ketoacidosis). Talk with your health care provider about what your results mean. Questions to ask your health care provider Ask your health care provider, or the department that  is doing the test:  When will my results be ready?  How will I get my results?  What are my treatment options?  What other tests do I need?  What are my next steps? Summary  A magnesium test is done to determine how much magnesium you have in your blood.  Your  potassium and calcium levels may also be tested because blood magnesium levels are closely related to the levels of these other minerals.  Talk with your health care provider about what your results mean. Certain medical conditions can cause high or low magnesium levels. This information is not intended to replace advice given to you by your health care provider. Make sure you discuss any questions you have with your health care provider. Document Released: 08/14/2004 Document Revised: 03/14/2017 Document Reviewed: 03/14/2017 Elsevier Patient Education  2020 [employer].

## 2019-06-19 NOTE — Progress Notes (Signed)
QUICK REFERENCE INFORMATION: The ABCs of Providing the Annual Wellness Visit  CMS.gov Medicare Learning Network  [employer] Annual Wellness Visit  Subjective:   Tammy [name] is a 73 y.o. Female who presents for an Annual Wellness Visit.  She is requesting screening for back cancer.  She reports that she is experiencing a lot of body pains and she feels like her left shoulder is weak, her right hip hurts and her bones don't move. She states that she takes magnesium sulfate. She denies any diarrhea She has had multiple MRIs and has severe degenerative joint disease throughout the cervical and lumbar spine as well as spurring of the right hip and left shoulder.  Patient Active Problem List   Diagnosis Date Noted  . Diverticulosis of large intestine 08/22/2017  . Statin intolerance 10/20/2016  . Cardiomegaly 10/20/2016  . SHOULDER PAIN, LEFT 08/20/2008  . Pain in limb 02/23/2008  . CHEST PAIN 01/11/2008  . HERPES ZOSTER 01/04/2008  . HYPERLIPIDEMIA 01/04/2008  . ESSENTIAL HYPERTENSION, BENIGN 01/04/2008    Past Medical History:  Diagnosis Date  . Colon polyps   . Hypertension      Past Surgical History:  Procedure Laterality Date  . ABDOMINAL HYSTERECTOMY  1982  . BUNIONECTOMY Bilateral      Outpatient Medications Prior to Visit  Medication Sig Dispense Refill  . aspirin EC 81 MG tablet Take 1 tablet (81 mg total) by mouth daily. 90 tablet 3  . Cholecalciferol (VITAMIN D3) 400 units CAPS Take by mouth.    . fluticasone (FLONASE) 50 MCG/ACT nasal spray Place 2 sprays into both nostrils daily. 16 g 6  . folic acid (FOLVITE) 800 MCG tablet Take 400 mcg by mouth daily.    . furosemide (LASIX) 20 MG tablet TAKE 1 TABLET EVERY DAY 90 tablet 1  . metoprolol tartrate (LOPRESSOR) 25 MG tablet TAKE 1 TABLET EVERY DAY 90 tablet 1  . Multiple Vitamins-Minerals (ALIVE ONCE DAILY WOMENS PO) Take 1 tablet by mouth daily.    . Omega-3 1000 MG CAPS Take by mouth.    . vitamin E 400 UNIT  capsule Take 400 Units by mouth daily.    . rosuvastatin (CRESTOR) 20 MG tablet Take 1 tablet (20 mg total) by mouth every other day. (Patient not taking: Reported on 06/19/2019) 45 tablet 0  . SUMAtriptan (IMITREX) 50 MG tablet Take 1 tablet (50 mg total) by mouth every 2 (two) hours as needed for migraine. May repeat in 2 hours if headache persists or recurs. (Patient not taking: Reported on 06/19/2019) 10 tablet 0  . benzonatate (TESSALON) 100 MG capsule Take 1 capsule (100 mg total) by mouth 2 (two) times daily as needed for cough. 20 capsule 0  . guaiFENesin (MUCINEX) 600 MG 12 hr tablet Take 2 tablets (1,200 mg total) by mouth 2 (two) times daily. 60 tablet 1  . ipratropium (ATROVENT) 0.03 % nasal spray Place 2 sprays into both nostrils 2 (two) times daily. (Patient not taking: Reported on 06/19/2019) 30 mL 0   No facility-administered medications prior to visit.     Allergies  Allergen Reactions  . Neurontin [Gabapentin] Itching     Family History  Problem Relation Age of Onset  . Diabetes Mother   . Diabetes Sister   . Diabetes Brother   . Diabetes Sister      Social History   Socioeconomic History  . Marital status: Divorced    Spouse name: Not on file  . Number of children: 2  . Years  of education: Not on file  . Highest education level: Not on file  Occupational History  . Occupation: retired  Engineer, production  . Financial resource strain: Not on file  . Food insecurity    Worry: Not on file    Inability: Not on file  . Transportation needs    Medical: Not on file    Non-medical: Not on file  Tobacco Use  . Smoking status: Former Smoker    Years: 3.00    Quit date: 07/26/1973    Years since quitting: 45.9  . Smokeless tobacco: Never Used  Substance and Sexual Activity  . Alcohol use: No  . Drug use: No  . Sexual activity: Not on file  Lifestyle  . Physical activity    Days per week: Not on file    Minutes per session: Not on file  . Stress: Not on file   Relationships  . Social Musician on phone: Not on file    Gets together: Not on file    Attends religious service: Not on file    Active member of club or organization: Not on file    Attends meetings of clubs or organizations: Not on file    Relationship status: Not on file  Other Topics Concern  . Not on file  Social History Narrative  . Not on file      Recent Hospitalizations? No  Health Habits: Current exercise activities include: none Exercise: 0 times/week. Diet: in general, a "healthy" diet    Alcohol intake: none  Health Risk Assessment: The patient has completed a Health Risk Assessment. This has been reveiwed with them and has been scanned into the [place] system as an attached document.  Current Medical Providers and Suppliers: [facility] Patient Care Team: Doristine Bosworth, MD as PCP - General (Internal Medicine) No future appointments.   Age-appropriate Screening Schedule: The list below includes current immunization status and future screening recommendations based on patient's age. Orders for these recommended tests are listed in the plan section. The patient has been provided with a written plan. Immunization History  Administered Date(s) Administered  . Fluad Quad(high Dose 65+) 06/19/2019  . Influenza, High Dose Seasonal PF 05/09/2018  . Influenza,inj,Quad PF,6+ Mos 04/05/2017, 08/03/2017  . Influenza-Unspecified 09/09/2015  . Pneumococcal Conjugate-13 08/22/2014  . Pneumococcal Polysaccharide-23 07/30/2013  . Tdap 09/10/2009      Depression Screen-PHQ2/9 completed today  Depression screen [facility] 2/9 06/19/2019 09/14/2018 05/09/2018 05/09/2018 09/01/2017  Decreased Interest 0 0 2 0 0  Down, Depressed, Hopeless 0 0 0 0 0  PHQ - 2 Score 0 0 2 0 0  Altered sleeping - - 2 - -  Tired, decreased energy - - 3 - -  Change in appetite - - 3 - -  Feeling bad or failure about yourself  - - 2 - -  Trouble concentrating - - 3 - -  Moving slowly or  fidgety/restless - - 3 - -  Suicidal thoughts - - 0 - -  PHQ-9 Score - - 18 - -  Difficult doing work/chores - - Not difficult at all - -       Depression Severity and Treatment Recommendations:  0-4= None  5-9= Mild / Treatment: Support, educate to call if worse; return in one month  10-14= Moderate / Treatment: Support, watchful waiting; Antidepressant or Psycotherapy  15-19= Moderately severe / Treatment: Antidepressant OR Psychotherapy  >= 20 = Major depression, severe / Antidepressant AND Psychotherapy  Functional Status  Survey:   Is the patient deaf or have difficulty hearing?: Yes Does the patient have difficulty seeing, even when wearing glasses/contacts?: No Does the patient have difficulty concentrating, remembering, or making decisions?: Yes(memory) Does the patient have difficulty walking or climbing stairs?: No Does the patient have difficulty dressing or bathing?: No Does the patient have difficulty doing errands alone such as visiting a doctor's office or shopping?: No  Hearing Evaluation: 1. Do you have trouble hearing the television when others do not?   No 2. Do you have to strain to hear/understand conversations? No   Advanced Care Planning: 1. Patient has executed an Advance Directive: No 2. If no, patient was given the opportunity to execute an Advance Directive today? Yes 3. Are the patient's advanced directives in [place]? No 4. This patient has the ability to prepare an Advance Directive: Yes 5. Provider is willing to follow the patient's wishes: Yes  Cognitive Assessment: Does the patient have evidence of cognitive impairment? No The patient does not have any evidence of any cognitive problems and denies any  change in mood/affect, appearance, speech, memory or motor skills.  Identification of Risk Factors: Risk factors include: hypertension  ROS Review of Systems  Constitutional: Negative for activity change, appetite change, chills and fever.   HENT: Negative for congestion, nosebleeds, trouble swallowing and voice change.   Respiratory: Negative for cough, shortness of breath and wheezing.   Gastrointestinal: Negative for diarrhea, nausea and vomiting.  Genitourinary: Negative for difficulty urinating, dysuria, flank pain and hematuria.  Musculoskeletal: Negative for back pain, joint swelling and neck pain.  Neurological: Negative for dizziness, speech difficulty, light-headedness and numbness.  See HPI. All other review of systems negative.    Objective:   Vitals:   06/19/19 0901  BP: 138/84  Pulse: 91  Temp: 98.4 F (36.9 C)  TempSrc: Oral  SpO2: 96%  Weight: 267 lb 3.2 oz (121.2 kg)  Height: 5\' 6"  (1.676 m)    Body mass index is 43.13 kg/m.    Physical Exam  Constitutional: Oriented to person, place, and time. Appears well-developed and well-nourished.  HENT:  Head: Normocephalic and atraumatic.  Eyes: Conjunctivae and EOM are normal.  Cardiovascular: Normal rate, regular rhythm, normal heart sounds and intact distal pulses.  No murmur heard. Pulmonary/Chest: Effort normal and breath sounds normal. No stridor. No respiratory distress. Has no wheezes.  Abdomen: nondistended, normoactive bs, soft, nontender Neurological: Is alert and oriented to person, place, and time.  Skin: Skin is warm. Capillary refill takes less than 2 seconds.  Psychiatric: Has a normal mood and affect. Behavior is normal. Judgment and thought content normal.     Assessment/Plan:   Patient Self-Management and Personalized Health Advice The patient has been provided with information about:  begin progressive daily aerobic exercise program, follow a low fat, low cholesterol diet and attempt to lose weight  During the course of the visit the patient was educated and counseled about appropriate screening and preventive services including:    Body mass index is 43.13 kg/m. Discussed the patient's BMI with her. The BMI BMI is not in  the acceptable range; BMI management plan is completed  Tyler AasDoris was seen today for cancer check of the back and awv.  Diagnoses and all orders for this visit:  Encounter for Medicare annual wellness exam - Women's Health Maintenance Plan Advised monthly breast exam and annual mammogram  Advised dental exam every six months Discussed stress management   Need for immunization against influenza -  Flu Vaccine QUAD High Dose(Fluad)  Essential hypertension- bp stable -     Basic metabolic panel -     Lipid panel  At risk for magnesium sulfate toxicity  -     Magnesium  Statin intolerance -     Lipid panel  Dyslipidemia -     Lipid panel  Other orders -     Cancel: Flu vaccine HIGH DOSE PF (Fluzone High dose)      No follow-ups on file.  No future appointments.  Patient Instructions       If you have lab work done today you will be contacted with your lab results within the next 2 weeks.  If you have not heard from [place] then please contact us. The fastest way to get your results is to register for My Chart.   IF you received an x-ray today, you will receive an invoice from [facility]. Please contact [facility] at [phone] with questions or concerns regarding your invoice.   IF you received labwork today, you will receive an invoice from [place]. Please contact [facility] at [phone] with questions or concerns regarding your invoice.   Our billing staff will not be able to assist you with questions regarding bills from these companies.  You will be contacted with the lab results as soon as they are available. The fastest way to get your results is to activate your My Chart account. Instructions are located on the last page of this paperwork. If you have not heard from [place] regarding the results in 2 weeks, please contact this office.        An after visit summary with all of these plans was given to the patient.

## 2019-06-20 LAB — BASIC METABOLIC PANEL
BUN/Creatinine Ratio: 20 (ref 12–28)
BUN: 19 mg/dL (ref 8–27)
CO2: 27 mmol/L (ref 20–29)
Calcium: 9.8 mg/dL (ref 8.7–10.3)
Chloride: 100 mmol/L (ref 96–106)
Creatinine, Ser: 0.94 mg/dL (ref 0.57–1.00)
GFR calc Af Amer: 70 mL/min/{1.73_m2} (ref >59)
GFR calc non Af Amer: 61 mL/min/{1.73_m2} (ref >59)
Glucose: 109 mg/dL — ABNORMAL HIGH (ref 65–99)
Potassium: 4.8 mmol/L (ref 3.5–5.2)
Sodium: 138 mmol/L (ref 134–144)

## 2019-06-20 LAB — LIPID PANEL
Chol/HDL Ratio: 4.7 ratio — ABNORMAL HIGH (ref 0.0–4.4)
Cholesterol, Total: 277 mg/dL — ABNORMAL HIGH (ref 100–199)
HDL: 59 mg/dL (ref >39)
LDL Chol Calc (NIH): 199 mg/dL — ABNORMAL HIGH (ref 0–99)
Triglycerides: 109 mg/dL (ref 0–149)
VLDL Cholesterol Cal: 19 mg/dL (ref 5–40)

## 2019-06-20 LAB — MAGNESIUM: Magnesium: 2.5 mg/dL — ABNORMAL HIGH (ref 1.6–2.3)

## 2019-07-09 ENCOUNTER — Telehealth: Payer: Self-pay | Admitting: Family Medicine

## 2019-07-09 NOTE — Telephone Encounter (Signed)
Pt fell last week and having sever pain on knee would like to know if Dr. Nolon Rod can order an MRI to see what is going on   Please advise if appointment is needed sooner than the one she already has scheduled ?  [phone]

## 2019-07-10 NOTE — Telephone Encounter (Signed)
Please advise 

## 2019-07-17 ENCOUNTER — Telehealth (INDEPENDENT_AMBULATORY_CARE_PROVIDER_SITE_OTHER): Payer: Medicare HMO | Admitting: Family Medicine

## 2019-07-17 ENCOUNTER — Other Ambulatory Visit: Payer: Self-pay

## 2019-07-17 DIAGNOSIS — M25561 Pain in right knee: Secondary | ICD-10-CM | POA: Diagnosis not present

## 2019-07-17 NOTE — Progress Notes (Signed)
Telemedicine Encounter- SOAP NOTE Established Patient  This telephone encounter was conducted with the patient's (or proxy's) verbal consent via audio telecommunications: yes/no: Yes Patient was instructed to have this encounter in a suitably private space; and to only have persons present to whom they give permission to participate. In addition, patient identity was confirmed by use of name plus two identifiers (DOB and address).  I discussed the limitations, risks, security and privacy concerns of performing an evaluation and management service by telephone and the availability of in person appointments. I also discussed with the patient that there may be a patient responsible charge related to this service. The patient expressed understanding and agreed to proceed.  I spent a total of TIME; 0 MIN TO 60 MIN: 15 minutes talking with the patient or their proxy.  Chief Complaint  Patient presents with  . discuss elevated magnesium/pain management    Subjective   Tammy [name] is a 73 y.o. established patient. Telephone visit today for  HPI   She fell and had pain and swelling of the right knee  She fell in November but did not fall on the knee chap She states that she broke the fall and fell on her right hip    Patient Active Problem List   Diagnosis Date Noted  . Diverticulosis of large intestine 08/22/2017  . Statin intolerance 10/20/2016  . Cardiomegaly 10/20/2016  . SHOULDER PAIN, LEFT 08/20/2008  . Pain in limb 02/23/2008  . CHEST PAIN 01/11/2008  . HERPES ZOSTER 01/04/2008  . HYPERLIPIDEMIA 01/04/2008  . ESSENTIAL HYPERTENSION, BENIGN 01/04/2008    Past Medical History:  Diagnosis Date  . Colon polyps   . Hypertension     Current Outpatient Medications  Medication Sig Dispense Refill  . aspirin EC 81 MG tablet Take 1 tablet (81 mg total) by mouth daily. 90 tablet 3  . Cholecalciferol (VITAMIN D3) 400 units CAPS Take by mouth.    . fluticasone (FLONASE) 50  MCG/ACT nasal spray Place 2 sprays into both nostrils daily. 16 g 6  . furosemide (LASIX) 20 MG tablet TAKE 1 TABLET EVERY DAY 90 tablet 1  . metoprolol tartrate (LOPRESSOR) 25 MG tablet TAKE 1 TABLET EVERY DAY 90 tablet 1  . Multiple Vitamins-Minerals (ALIVE ONCE DAILY WOMENS PO) Take 1 tablet by mouth daily.    . Omega-3 1000 MG CAPS Take by mouth.    . vitamin E 400 UNIT capsule Take 400 Units by mouth daily.    . folic acid (FOLVITE) 800 MCG tablet Take 400 mcg by mouth daily.    . rosuvastatin (CRESTOR) 20 MG tablet Take 1 tablet (20 mg total) by mouth every other day. (Patient not taking: Reported on 07/17/2019) 45 tablet 0  . SUMAtriptan (IMITREX) 50 MG tablet Take 1 tablet (50 mg total) by mouth every 2 (two) hours as needed for migraine. May repeat in 2 hours if headache persists or recurs. (Patient not taking: Reported on 07/17/2019) 10 tablet 0   No current facility-administered medications for this visit.    Allergies  Allergen Reactions  . Neurontin [Gabapentin] Itching    Social History   Socioeconomic History  . Marital status: Divorced    Spouse name: Not on file  . Number of children: 2  . Years of education: Not on file  . Highest education level: Not on file  Occupational History  . Occupation: retired  Tobacco Use  . Smoking status: Former Smoker    Years: 3.00  Quit date: 07/26/1973    Years since quitting: 46.0  . Smokeless tobacco: Never Used  Substance and Sexual Activity  . Alcohol use: No  . Drug use: No  . Sexual activity: Not on file  Other Topics Concern  . Not on file  Social History Narrative  . Not on file   Social Determinants of Health   Financial Resource Strain:   . Difficulty of Paying Living Expenses: Not on file  Food Insecurity:   . Worried About Charity fundraiser in the Last Year: Not on file  . Ran Out of Food in the Last Year: Not on file  Transportation Needs:   . Lack of Transportation (Medical): Not on file  . Lack of  Transportation (Non-Medical): Not on file  Physical Activity:   . Days of Exercise per Week: Not on file  . Minutes of Exercise per Session: Not on file  Stress:   . Feeling of Stress : Not on file  Social Connections:   . Frequency of Communication with Friends and Family: Not on file  . Frequency of Social Gatherings with Friends and Family: Not on file  . Attends Religious Services: Not on file  . Active Member of Clubs or Organizations: Not on file  . Attends Archivist Meetings: Not on file  . Marital Status: Not on file  Intimate Partner Violence:   . Fear of Current or Ex-Partner: Not on file  . Emotionally Abused: Not on file  . Physically Abused: Not on file  . Sexually Abused: Not on file    ROS Review of Systems  Constitutional: Negative for activity change, appetite change, chills and fever.  HENT: Negative for congestion, nosebleeds, trouble swallowing and voice change.   Respiratory: Negative for cough, shortness of breath and wheezing.   Gastrointestinal: Negative for diarrhea, nausea and vomiting.  Genitourinary: Negative for difficulty urinating, dysuria, flank pain and hematuria.  Musculoskeletal: see hpi Neurological: Negative for dizziness, speech difficulty, light-headedness and numbness.  See HPI. All other review of systems negative.    Objective   Unable to perform exam  EXAM: LEFT KNEE - COMPLETE 4+ VIEW  COMPARISON:  None.  FINDINGS: No fracture of the proximal tibia or distal femur. Patella is normal. No joint effusion.  IMPRESSION: No fracture or dislocation.   Electronically Signed   By: Suzy Bouchard M.D.   On: 12/09/2016 12:08   Vitals as reported by the patient: There were no vitals filed for this visit.  [name] was seen today for discuss elevated magnesium/pain management.  Diagnoses and all orders for this visit:  Right medial knee pain -     Ambulatory referral to Orthopedic Surgery   -  At this point  xray is not necessary, will refer to Orthopedics -  Discussed treatment options   I discussed the assessment and treatment plan with the patient. The patient was provided an opportunity to ask questions and all were answered. The patient agreed with the plan and demonstrated an understanding of the instructions.   The patient was advised to call back or seek an in-person evaluation if the symptoms worsen or if the condition fails to improve as anticipated.  I provided 15 minutes of non-face-to-face time during this encounter.  Forrest Moron, MD  Primary Care at [facility]

## 2019-07-17 NOTE — Progress Notes (Signed)
[id number] elevated magnesium and pain management.  Pt is requesting order for MRI for shooting pain in her right knee.  No travel outside the [place] or [place] in the past 3 weeks.  No recent weight or bp taken.  No med refills needed at this time.

## 2019-07-17 NOTE — Patient Instructions (Signed)
° ° ° °  If you have lab work done today you will be contacted with your lab results within the next 2 weeks.  If you have not heard from us then please contact us. The fastest way to get your results is to register for My Chart. ° ° °IF you received an x-ray today, you will receive an invoice from [facility]. Please contact [facility] at [phone] with questions or concerns regarding your invoice.  ° °IF you received labwork today, you will receive an invoice from [facility]. Please contact [facility] at [phone] with questions or concerns regarding your invoice.  ° °Our billing staff will not be able to assist you with questions regarding bills from these companies. ° °You will be contacted with the lab results as soon as they are available. The fastest way to get your results is to activate your My Chart account. Instructions are located on the last page of this paperwork. If you have not heard from us regarding the results in 2 weeks, please contact this office. °  ° ° ° °

## 2019-07-31 ENCOUNTER — Ambulatory Visit: Payer: Medicare HMO | Admitting: Orthopaedic Surgery

## 2019-11-20 ENCOUNTER — Other Ambulatory Visit: Payer: Self-pay

## 2019-11-20 ENCOUNTER — Ambulatory Visit (INDEPENDENT_AMBULATORY_CARE_PROVIDER_SITE_OTHER): Payer: Medicare HMO | Admitting: Family Medicine

## 2019-11-20 DIAGNOSIS — I10 Essential (primary) hypertension: Secondary | ICD-10-CM | POA: Diagnosis not present

## 2019-11-21 LAB — COMPREHENSIVE METABOLIC PANEL
ALT: 21 IU/L (ref 0–32)
AST: 22 IU/L (ref 0–40)
Albumin/Globulin Ratio: 1.4 (ref 1.2–2.2)
Albumin: 4.4 g/dL (ref 3.7–4.7)
Alkaline Phosphatase: 85 IU/L (ref 39–117)
BUN/Creatinine Ratio: 14 (ref 12–28)
BUN: 14 mg/dL (ref 8–27)
Bilirubin Total: 0.3 mg/dL (ref 0.0–1.2)
CO2: 28 mmol/L (ref 20–29)
Calcium: 9.5 mg/dL (ref 8.7–10.3)
Chloride: 101 mmol/L (ref 96–106)
Creatinine, Ser: 0.97 mg/dL (ref 0.57–1.00)
GFR calc Af Amer: 67 mL/min/{1.73_m2} (ref >59)
GFR calc non Af Amer: 58 mL/min/{1.73_m2} — ABNORMAL LOW (ref >59)
Globulin, Total: 3.2 g/dL (ref 1.5–4.5)
Glucose: 110 mg/dL — ABNORMAL HIGH (ref 65–99)
Potassium: 4.8 mmol/L (ref 3.5–5.2)
Sodium: 141 mmol/L (ref 134–144)
Total Protein: 7.6 g/dL (ref 6.0–8.5)

## 2019-11-21 LAB — LIPID PANEL
Chol/HDL Ratio: 4.3 ratio (ref 0.0–4.4)
Cholesterol, Total: 241 mg/dL — ABNORMAL HIGH (ref 100–199)
HDL: 56 mg/dL (ref >39)
LDL Chol Calc (NIH): 168 mg/dL — ABNORMAL HIGH (ref 0–99)
Triglycerides: 97 mg/dL (ref 0–149)
VLDL Cholesterol Cal: 17 mg/dL (ref 5–40)

## 2019-11-26 ENCOUNTER — Encounter: Payer: Self-pay | Admitting: Family Medicine

## 2019-11-26 ENCOUNTER — Ambulatory Visit: Payer: Medicare HMO | Admitting: Family Medicine

## 2019-11-26 ENCOUNTER — Other Ambulatory Visit: Payer: Self-pay

## 2019-11-26 ENCOUNTER — Other Ambulatory Visit: Payer: Self-pay | Admitting: Family Medicine

## 2019-11-26 VITALS — BP 136/82 | HR 101 | Temp 98.3°F | Ht 66.0 in | Wt 269.8 lb

## 2019-11-26 DIAGNOSIS — R5382 Chronic fatigue, unspecified: Secondary | ICD-10-CM | POA: Diagnosis not present

## 2019-11-26 DIAGNOSIS — G4730 Sleep apnea, unspecified: Secondary | ICD-10-CM | POA: Diagnosis not present

## 2019-11-26 NOTE — Progress Notes (Signed)
Established Patient Office Visit  Subjective:  Patient ID: Tammy [name], female    DOB: Dec 10, 1945  Age: 74 y.o. MRN: [id number]  CC:  Chief Complaint  Patient presents with  . Insomnia    been having issues with sleeping for the past few months. Sleep all the time and feels like I havenyt got enough sleep.If I sit down I will dose off to sleep. Sometimes falls asleep at a stop light    HPI Tammy [name] presents for   Patient reports that if she sits down she doses off very easily She states that she dosed off waiting for the office visit to start   She SNORES so hard she wakes herself up and sometimes she feels her tongue shaking. She has coughed and choked herself awake.  She could sleep anywhere. She sleeps while standing up in the shower. She only drives short distances She states that she can sleep the whole day away and still feel tired She might go to bed at 7pm and the next morning she is tired.  She will wake up a few times at  Night to go to the bathroom but she falls asleep on the toilet. She is woken up by her bladder but can't stay awake to pee sometimes.  She has never wet the bed but has had urinary leakage going to the bathroom.  Past Medical History:  Diagnosis Date  . Colon polyps   . Hypertension     Past Surgical History:  Procedure Laterality Date  . ABDOMINAL HYSTERECTOMY  1982  . BUNIONECTOMY Bilateral     Family History  Problem Relation Age of Onset  . Diabetes Mother   . Diabetes Sister   . Diabetes Brother   . Diabetes Sister     Social History   Socioeconomic History  . Marital status: Divorced    Spouse name: Not on file  . Number of children: 2  . Years of education: Not on file  . Highest education level: Not on file  Occupational History  . Occupation: retired  Tobacco Use  . Smoking status: Former Smoker    Years: 3.00    Quit date: 07/26/1973    Years since quitting: 46.3  . Smokeless tobacco: Never Used  Substance and  Sexual Activity  . Alcohol use: No  . Drug use: No  . Sexual activity: Not on file  Other Topics Concern  . Not on file  Social History Narrative  . Not on file   Social Determinants of Health   Financial Resource Strain:   . Difficulty of Paying Living Expenses:   Food Insecurity:   . Worried About Charity fundraiser in the Last Year:   . Arboriculturist in the Last Year:   Transportation Needs:   . Film/video editor (Medical):   Marland Kitchen Lack of Transportation (Non-Medical):   Physical Activity:   . Days of Exercise per Week:   . Minutes of Exercise per Session:   Stress:   . Feeling of Stress :   Social Connections:   . Frequency of Communication with Friends and Family:   . Frequency of Social Gatherings with Friends and Family:   . Attends Religious Services:   . Active Member of Clubs or Organizations:   . Attends Archivist Meetings:   Marland Kitchen Marital Status:   Intimate Partner Violence:   . Fear of Current or Ex-Partner:   . Emotionally Abused:   Marland Kitchen Physically  Abused:   . Sexually Abused:     Outpatient Medications Prior to Visit  Medication Sig Dispense Refill  . aspirin EC 81 MG tablet Take 1 tablet (81 mg total) by mouth daily. 90 tablet 3  . Cholecalciferol (VITAMIN D3) 400 units CAPS Take by mouth.    . fluticasone (FLONASE) 50 MCG/ACT nasal spray SPRAY 2 SPRAYS INTO EACH NOSTRIL EVERY DAY 48 mL 2  . folic acid (FOLVITE) 800 MCG tablet Take 400 mcg by mouth daily.    . furosemide (LASIX) 20 MG tablet TAKE 1 TABLET EVERY DAY 90 tablet 1  . metoprolol tartrate (LOPRESSOR) 25 MG tablet TAKE 1 TABLET EVERY DAY 90 tablet 1  . Multiple Vitamins-Minerals (ALIVE ONCE DAILY WOMENS PO) Take 1 tablet by mouth daily.    . Omega-3 1000 MG CAPS Take by mouth.    . rosuvastatin (CRESTOR) 20 MG tablet Take 1 tablet (20 mg total) by mouth every other day. 45 tablet 0  . SUMAtriptan (IMITREX) 50 MG tablet Take 1 tablet (50 mg total) by mouth every 2 (two) hours as needed  for migraine. May repeat in 2 hours if headache persists or recurs. 10 tablet 0  . vitamin E 400 UNIT capsule Take 400 Units by mouth daily.     No facility-administered medications prior to visit.    Allergies  Allergen Reactions  . Neurontin [Gabapentin] Itching    ROS Review of Systems Review of Systems  Constitutional: Negative for activity change, appetite change, chills and fever.  HENT: Negative for congestion, nosebleeds, trouble swallowing and voice change.   Respiratory: Negative for cough, shortness of breath and wheezing.   Gastrointestinal: Negative for diarrhea, nausea and vomiting.  Genitourinary: Negative for difficulty urinating, dysuria, flank pain and hematuria.  Musculoskeletal: Negative for back pain, joint swelling and neck pain.  Neurological: Negative for dizziness, speech difficulty, light-headedness and numbness.  See HPI. All other review of systems negative.     Objective:    Physical Exam  BP 136/82 (BP Location: Right Arm, Patient Position: Sitting, Cuff Size: Large)   Pulse (!) 101   Temp 98.3 F (36.8 C) (Temporal)   Ht 5\' 6"  (1.676 m)   Wt 269 lb 12.8 oz (122.4 kg)   SpO2 99%   BMI 43.55 kg/m  Wt Readings from Last 3 Encounters:  11/26/19 269 lb 12.8 oz (122.4 kg)  06/19/19 267 lb 3.2 oz (121.2 kg)  09/14/18 252 lb 6.4 oz (114.5 kg)   Physical Exam  Constitutional: Oriented to person, place, and time. Appears well-developed and well-nourished. Morbidly obese  HENT:  Head: Normocephalic and atraumatic.  Eyes: Conjunctivae and EOM are normal.  Neck: large neck, supple, no thyromegaly Cardiovascular: Normal rate, regular rhythm, normal heart sounds and intact distal pulses.  No murmur heard. Pulmonary/Chest: Effort normal and breath sounds normal. No stridor. No respiratory distress. Has no wheezes.  Neurological: Is alert and oriented to person, place, and time.  Skin: Skin is warm. Capillary refill takes less than 2 seconds.    Psychiatric: Has a normal mood and affect. Behavior is normal. Judgment and thought content normal.    There are no preventive care reminders to display for this patient.  There are no preventive care reminders to display for this patient.  Lab Results  Component Value Date   TSH 0.852 12/25/2007   Lab Results  Component Value Date   WBC 5.3 12/15/2016   HGB 11.1 (A) 12/15/2016   HCT 33.3 (A) 12/15/2016  MCV 84.8 12/15/2016   PLT 214 02/10/2008   Lab Results  Component Value Date   NA 141 11/20/2019   K 4.8 11/20/2019   CO2 28 11/20/2019   GLUCOSE 110 (H) 11/20/2019   BUN 14 11/20/2019   CREATININE 0.97 11/20/2019   BILITOT 0.3 11/20/2019   ALKPHOS 85 11/20/2019   AST 22 11/20/2019   ALT 21 11/20/2019   PROT 7.6 11/20/2019   ALBUMIN 4.4 11/20/2019   CALCIUM 9.5 11/20/2019   GFR 98.15 02/01/2017   Lab Results  Component Value Date   CHOL 241 (H) 11/20/2019   Lab Results  Component Value Date   HDL 56 11/20/2019   Lab Results  Component Value Date   LDLCALC 168 (H) 11/20/2019   Lab Results  Component Value Date   TRIG 97 11/20/2019   Lab Results  Component Value Date   CHOLHDL 4.3 11/20/2019   No results found for: HGBA1C    Assessment & Plan:   Problem List Items Addressed This Visit    None    Visit Diagnoses    Sleep disorder breathing    -  Primary Patient shows signs of OSA Will send for sleep study She is advised about driving and that she should avoid driving until testing is complete Risk factors for OSA - weight gain, obesity, age, gender, shape of neck are all considerations    Relevant Orders   Nocturnal polysomnography (NPSG)   Ferritin   TSH   CBC   Chronic fatigue       Relevant Orders   Nocturnal polysomnography (NPSG)   Ferritin   TSH   CBC   Morbid obesity (HCC)    -  Discussed morbid obesity which increases risk of OSA     No orders of the defined types were placed in this encounter.   Follow-up: No follow-ups  on file.   A total of 30 minutes were spent face-to-face with the patient during this encounter and over half of that time was spent on counseling and coordination of care.   Doristine Bosworth, MD

## 2019-11-26 NOTE — Patient Instructions (Addendum)
If you have lab work done today you will be contacted with your lab results within the next 2 weeks.  If you have not heard from [place] then please contact us. The fastest way to get your results is to register for My Chart.   IF you received an x-ray today, you will receive an invoice from [facility]. Please contact [facility] at [phone] with questions or concerns regarding your invoice.   IF you received labwork today, you will receive an invoice from [facility]. Please contact [facility] at [phone] with questions or concerns regarding your invoice.   Our billing staff will not be able to assist you with questions regarding bills from these companies.  You will be contacted with the lab results as soon as they are available. The fastest way to get your results is to activate your My Chart account. Instructions are located on the last page of this paperwork. If you have not heard from [place] regarding the results in 2 weeks, please contact this office.      Living With Sleep Apnea Sleep apnea is a condition in which breathing pauses or becomes shallow during sleep. Sleep apnea is most commonly caused by a collapsed or blocked airway. People with sleep apnea snore loudly and have times when they gasp and stop breathing for 10 seconds or more during sleep. This happens over and over during the night. This disrupts your sleep and keeps your body from getting the rest that it needs, which can cause tiredness and lack of energy (fatigue) during the day. The breaks in breathing also interrupt the deep sleep that you need to feel rested. Even if you do not completely wake up from the gaps in breathing, your sleep may not be restful. You may also have a headache in the morning and low energy during the day, and you may feel anxious or depressed. How can sleep apnea affect me? Sleep apnea increases your chances of extreme tiredness during the day (daytime fatigue). It can also  increase your risk for health conditions, such as:  Heart attack.  Stroke.  Diabetes.  Heart failure.  Irregular heartbeat.  High blood pressure. If you have daytime fatigue as a result of sleep apnea, you may be more likely to:  Perform poorly at school or work.  Fall asleep while driving.  Have difficulty with attention.  Develop depression or anxiety.  Become severely overweight (obese).  Have sexual dysfunction. What actions can I take to manage sleep apnea? Sleep apnea treatment   If you were given a device to open your airway while you sleep, use it only as told by your health care provider. You may be given: ? An oral appliance. This is a custom-made mouthpiece that shifts your lower jaw forward. ? A continuous positive airway pressure (CPAP) device. This device blows air through a mask when you breathe out (exhale). ? A nasal expiratory positive airway pressure (EPAP) device. This device has valves that you put into each nostril. ? A bi-level positive airway pressure (BPAP) device. This device blows air through a mask when you breathe in (inhale) and breathe out (exhale).  You may need surgery if other treatments do not work for you. Sleep habits  Go to sleep and wake up at the same time every day. This helps set your internal clock (circadian rhythm) for sleeping. ? If you stay up later than usual, such as on weekends, try to get up in the morning within 2  hours of your normal wake time.  Try to get at least 7-9 hours of sleep each night.  Stop computer, tablet, and mobile phone use a few hours before bedtime.  Do not take long naps during the day. If you nap, limit it to 30 minutes.  Have a relaxing bedtime routine. Reading or listening to music may relax you and help you sleep.  Use your bedroom only for sleep. ? Keep your television and computer out of your bedroom. ? Keep your bedroom cool, dark, and quiet. ? Use a supportive mattress and  pillows.  Follow your health care provider's instructions for other changes to sleep habits. Nutrition  Do not eat heavy meals in the evening.  Do not have caffeine in the later part of the day. The effects of caffeine can last for more than 5 hours.  Follow your health care provider's or dietitian's instructions for any diet changes. Lifestyle      Do not drink alcohol before bedtime. Alcohol can cause you to fall asleep at first, but then it can cause you to wake up in the middle of the night and have trouble getting back to sleep.  Do not use any products that contain nicotine or tobacco, such as cigarettes and e-cigarettes. If you need help quitting, ask your health care provider. Medicines  Take over-the-counter and prescription medicines only as told by your health care provider.  Do not use over-the-counter sleep medicine. You can become dependent on this medicine, and it can make sleep apnea worse.  Do not use medicines, such as sedatives and narcotics, unless told by your health care provider. Activity  Exercise on most days, but avoid exercising in the evening. Exercising near bedtime can interfere with sleeping.  If possible, spend time outside every day. Natural light helps regulate your circadian rhythm. General information  Lose weight if you need to, and maintain a healthy weight.  Keep all follow-up visits as told by your health care provider. This is important.  If you are having surgery, make sure to tell your health care provider that you have sleep apnea. You may need to bring your device with you. Where to find more information Learn more about sleep apnea and daytime fatigue from:  American Sleep Association: sleepassociation.org  National Sleep Foundation: sleepfoundation.org  National Heart, Lung, and Blood Institute: [employer] Summary  Sleep apnea can cause daytime fatigue and other serious health conditions.  Both sleep apnea and daytime  fatigue can be bad for your health and well-being.  You may need to wear a device while sleeping to help keep your airway open.  If you are having surgery, make sure to tell your health care provider that you have sleep apnea. You may need to bring your device with you.  Making changes to sleep habits, diet, lifestyle, and activity can help you manage sleep apnea. This information is not intended to replace advice given to you by your health care provider. Make sure you discuss any questions you have with your health care provider. Document Revised: 11/03/2018 Document Reviewed: 10/06/2017 Elsevier Patient Education  2020 [employer].

## 2019-11-27 ENCOUNTER — Telehealth: Payer: Self-pay | Admitting: Family Medicine

## 2019-11-27 ENCOUNTER — Other Ambulatory Visit: Payer: Self-pay | Admitting: Emergency Medicine

## 2019-11-27 DIAGNOSIS — G4730 Sleep apnea, unspecified: Secondary | ICD-10-CM

## 2019-11-27 LAB — FERRITIN: Ferritin: 82 ng/mL (ref 15–150)

## 2019-11-27 LAB — CBC
Hematocrit: 35.7 % (ref 34.0–46.6)
Hemoglobin: 11.6 g/dL (ref 11.1–15.9)
MCH: 28.9 pg (ref 26.6–33.0)
MCHC: 32.5 g/dL (ref 31.5–35.7)
MCV: 89 fL (ref 79–97)
Platelets: 293 10*3/uL (ref 150–450)
RBC: 4.01 x10E6/uL (ref 3.77–5.28)
RDW: 13.4 % (ref 11.7–15.4)
WBC: 5 10*3/uL (ref 3.4–10.8)

## 2019-11-27 LAB — TSH: TSH: 0.69 u[IU]/mL (ref 0.450–4.500)

## 2019-11-27 NOTE — Telephone Encounter (Signed)
Good afternoon,  Dr. Creta Levin placed and order for this pt to have a sleep study however, our office does not go based off orders from physicians outside or our office. If Dr. Creta Levin would like the pt seen a referral would need to be placed to [facility]. Then our physician would order the sleep study if needed for the pt. I will cancel the current order on this pt. Thank you.

## 2019-11-27 NOTE — Telephone Encounter (Signed)
Left a msg on Sheena @Neurology  office to return call. When Sheena return call please inform her I do not see where we have referred patient to Neurology. We need more information on question they have so we can discuss with the patient

## 2019-11-27 NOTE — Telephone Encounter (Signed)
FyI Dr Creta Levin this message came through about the sleep study. I put in a referral for this patient just wanted to fyi you this.  Thank you  Sunny Schlein

## 2019-11-27 NOTE — Telephone Encounter (Signed)
Sheena called from [facility]. She just has a couple questions regarding this pts referral. Sheena's number: [phone]  Please advise

## 2019-11-27 NOTE — Telephone Encounter (Signed)
Sheena called back to state that a referral is needed in order to see patient, any questions you can call her back

## 2019-12-03 ENCOUNTER — Telehealth: Payer: Self-pay

## 2019-12-03 NOTE — Telephone Encounter (Signed)
No ation required at this time, closing encounter from inbox 

## 2019-12-07 ENCOUNTER — Other Ambulatory Visit: Payer: Self-pay | Admitting: Family Medicine

## 2019-12-07 MED ORDER — FUROSEMIDE 20 MG PO TABS: 20.0000 mg | ORAL_TABLET | Freq: Every day | ORAL | 1 refills | Status: AC

## 2019-12-07 MED ORDER — METOPROLOL TARTRATE 25 MG PO TABS: 25.0000 mg | ORAL_TABLET | Freq: Every day | ORAL | 1 refills | Status: DC

## 2019-12-07 MED ORDER — FUROSEMIDE 20 MG PO TABS: 20.0000 mg | ORAL_TABLET | Freq: Every day | ORAL | 1 refills | Status: DC

## 2019-12-07 MED ORDER — METOPROLOL TARTRATE 25 MG PO TABS: 25.0000 mg | ORAL_TABLET | Freq: Every day | ORAL | 1 refills | Status: AC

## 2019-12-07 NOTE — Telephone Encounter (Signed)
metoprolol tartrate (LOPRESSOR) 25 MG tablet      Patient requesting refill.     Pharmacy:  [facility] - [place], [place] - [address] Phone:  [phone]  Fax:  [phone]

## 2019-12-18 DIAGNOSIS — H43393 Other vitreous opacities, bilateral: Secondary | ICD-10-CM | POA: Diagnosis not present

## 2019-12-18 DIAGNOSIS — H524 Presbyopia: Secondary | ICD-10-CM | POA: Diagnosis not present

## 2020-01-15 ENCOUNTER — Encounter: Payer: Self-pay | Admitting: Neurology

## 2020-01-15 ENCOUNTER — Ambulatory Visit: Payer: Medicare HMO | Admitting: Neurology

## 2020-01-15 ENCOUNTER — Other Ambulatory Visit: Payer: Self-pay

## 2020-01-15 VITALS — BP 140/82 | HR 72 | Ht 66.5 in | Wt 274.0 lb

## 2020-01-15 DIAGNOSIS — R351 Nocturia: Secondary | ICD-10-CM | POA: Diagnosis not present

## 2020-01-15 DIAGNOSIS — Z820 Family history of epilepsy and other diseases of the nervous system: Secondary | ICD-10-CM

## 2020-01-15 DIAGNOSIS — Z6841 Body Mass Index (BMI) 40.0 and over, adult: Secondary | ICD-10-CM

## 2020-01-15 DIAGNOSIS — R0683 Snoring: Secondary | ICD-10-CM | POA: Diagnosis not present

## 2020-01-15 DIAGNOSIS — R400 Somnolence: Secondary | ICD-10-CM | POA: Diagnosis not present

## 2020-01-15 NOTE — Progress Notes (Signed)
Subjective:    Patient ID: Tammy [name] is a 74 y.o. female.  HPI     Interim history:   Dear Dr. Nolon Rod,    I saw your patient, [name], upon your kind request in my neurologic clinic today for initial consultation of her sleep disorder, in particular, concern for underlying obstructive sleep apnea. The patient is unaccompanied today. As you know, Ms. Barbaro is a 74 year old right-handed woman with an underlying medical history of left knee pain, hypertension, hyperlipidemia, and morbid obesity with a BMI of over 40, who reports snoring and excessive daytime somnolence. I have evaluated her for sleep apnea in the past. She did not pursue a sleep study at the time. She reports severe sleepiness, her Epworth sleepiness score is 24 out of 24 today, fatigue severity score is 52 out of 63.  She has gained a significant amount of weight as well.  She typically tries to be in bed around 10 but sometimes may go even as early as 7 PM.  Rise time is around 6.  Sleep is disrupted, she has frequent nocturia, about 3-4 times per average night.  She used to have headaches in the mornings but none recently.  She has a family history of sleep apnea, her brother and 2 sisters are on CPAP machines.  She drinks caffeine and limitation, typically 1 cup of coffee per day and occasional tea, no sodas typically.  She has a TV in the bedroom but does not typically watch it at night.  She has no pets in the household.  She is divorced and lives alone, she has 2 grown sons.  The patient's allergies, current medications, family history, past medical history, past social history, past surgical history and problem list were reviewed and updated as appropriate.   Previously:   03/16/17: I reviewed your office note from 01/15/2017. Her Epworth sleepiness score is 17 out of 24, fatigue score is 39 out of 63. She is retired and lives alone. She is a nonsmoker and does not utilize alcohol and does not take illicit drugs,  drinks caffeine in the form of coffee, one or 2 per day, likes tea, not daily. Symptoms have been ongoing for a few years. She has been told she snores, and has woken herself up with it, has woken up with a sense of gasping. She goes to the bathroom 3 to 4 times per night. She has woken up with a HA. She has been utilizing an OTC liq sleep aid for the past 3 weeks, which helps her stay asleep a little. She has had some RLS symptoms and wakes up from legs moving at times. She has a tendency to body rock at night. BT is between 9 and 10 PM. WT is at 6 AM currently. No Alarm, retired from being a Glass blower/designer. She has 2 grown sons. She keeps children at her house, in-home daycare. She does not nap. She has become sleepy at times while standing.She has a strong family history of obstructive sleep apnea and several of her siblings.  Her Past Medical History Is Significant For: Past Medical History:  Diagnosis Date  . Colon polyps   . Hypertension     Her Past Surgical History Is Significant For: Past Surgical History:  Procedure Laterality Date  . ABDOMINAL HYSTERECTOMY  1982  . BUNIONECTOMY Bilateral     Her Family History Is Significant For: Family History  Problem Relation Age of Onset  . Diabetes Mother   . Diabetes  Sister   . Diabetes Brother   . Diabetes Sister     Her Social History Is Significant For: Social History   Socioeconomic History  . Marital status: Divorced    Spouse name: Not on file  . Number of children: 2  . Years of education: Not on file  . Highest education level: Not on file  Occupational History  . Occupation: retired  Tobacco Use  . Smoking status: Former Smoker    Years: 3.00    Quit date: 07/26/1973    Years since quitting: 46.5  . Smokeless tobacco: Never Used  Vaping Use  . Vaping Use: Never used  Substance and Sexual Activity  . Alcohol use: No  . Drug use: No  . Sexual activity: Not on file  Other Topics Concern  . Not on file  Social  History Narrative  . Not on file   Social Determinants of Health   Financial Resource Strain:   . Difficulty of Paying Living Expenses:   Food Insecurity:   . Worried About Programme researcher, broadcasting/film/video in the Last Year:   . Barista in the Last Year:   Transportation Needs:   . Freight forwarder (Medical):   Marland Kitchen Lack of Transportation (Non-Medical):   Physical Activity:   . Days of Exercise per Week:   . Minutes of Exercise per Session:   Stress:   . Feeling of Stress :   Social Connections:   . Frequency of Communication with Friends and Family:   . Frequency of Social Gatherings with Friends and Family:   . Attends Religious Services:   . Active Member of Clubs or Organizations:   . Attends Banker Meetings:   Marland Kitchen Marital Status:     Her Allergies Are:  Allergies  Allergen Reactions  . Neurontin [Gabapentin] Itching  :   Her Current Medications Are:  Outpatient Encounter Medications as of 01/15/2020  Medication Sig  . aspirin EC 81 MG tablet Take 1 tablet (81 mg total) by mouth daily.  . Cholecalciferol (VITAMIN D3) 400 units CAPS Take by mouth.  . fluticasone (FLONASE) 50 MCG/ACT nasal spray SPRAY 2 SPRAYS INTO EACH NOSTRIL EVERY DAY  . folic acid (FOLVITE) 800 MCG tablet Take 400 mcg by mouth daily.  . furosemide (LASIX) 20 MG tablet Take 1 tablet (20 mg total) by mouth daily.  . metoprolol tartrate (LOPRESSOR) 25 MG tablet Take 1 tablet (25 mg total) by mouth daily.  . Multiple Vitamins-Minerals (ALIVE ONCE DAILY WOMENS PO) Take 1 tablet by mouth daily.  . Omega-3 1000 MG CAPS Take by mouth.  . rosuvastatin (CRESTOR) 20 MG tablet Take 1 tablet (20 mg total) by mouth every other day.  . SUMAtriptan (IMITREX) 50 MG tablet Take 1 tablet (50 mg total) by mouth every 2 (two) hours as needed for migraine. May repeat in 2 hours if headache persists or recurs.  . vitamin E 400 UNIT capsule Take 400 Units by mouth daily.   No facility-administered encounter  medications on file as of 01/15/2020.  :  Review of Systems:  Out of a complete 14 point review of systems, all are reviewed and negative with the exception of these symptoms as listed below:  Review of Systems  Neurological:       Referred by PCP Dr. Creta Levin for excessive sleepiness. States that she has a hard time staying awake during the day. Has a hard time staying asleep at night. Denies ever having a sleep  study before. She was scheduled to have one a few years ago but decided at the last minute to cancel it.   Epworth Sleepiness Scale 0= would never doze 1= slight chance of dozing 2= moderate chance of dozing 3= high chance of dozing  Sitting and reading: 3 Watching TV: 3 Sitting inactive in a public place (ex. Theater or meeting): 3 As a passenger in a car for an hour without a break: 3 Lying down to rest in the afternoon: 3 Sitting and talking to someone: 3 Sitting quietly after lunch (no alcohol): 3 In a car, while stopped in traffic: 3 Total: 24     Objective:  Neurological Exam  Physical Exam Physical Examination:   Vitals:   01/15/20 0916  BP: 140/82  Pulse: 72    General Examination: The patient is a very pleasant 74 y.o. female in no acute distress. She appears well-developed and well-nourished and well groomed.   HEENT: Normocephalic, atraumatic, pupils are equal, round and reactive to light, s/p cataract extractions. Extraocular tracking is good without limitation to gaze excursion or nystagmus noted. Normal smooth pursuit is noted. Hearing is grossly intact. Face is symmetric with normal facial animation. Speech is clear with no dysarthria noted. There is no hypophonia. There is no lip, neck/head, jaw or voice tremor. Neck is supple with full range of passive and active motion. There are no carotid bruits on auscultation. Oropharynx exam reveals: mild mouth dryness, adequate dental hygiene and partial dentures upper and lower with moderate airway crowding,  due to wider tongue, smaller airway and tonsils in place. Mallampati is class II. Tongue protrudes centrally and palate elevates symmetrically. Tonsils are 1+ in size. Neck size is 17.75 inches. She has a minimal overbite.  Chest: Clear to auscultation without wheezing, rhonchi or crackles noted.  Heart: S1+S2+0, regular and normal without murmurs, rubs or gallops noted.   Abdomen: Soft, non-tender and non-distended with normal bowel sounds appreciated on auscultation.  Extremities: There is trace pitting edema in the ankles.   Skin: Warm and dry without trophic changes noted. There are no varicose veins.  Musculoskeletal: exam reveals no obvious joint deformities, tenderness or joint swelling or erythema.   Neurologically:  Mental status: The patient is awake, alert and oriented in all 4 spheres. Her immediate and remote memory, attention, language skills and fund of knowledge are appropriate. There is no evidence of aphasia, agnosia, apraxia or anomia. Speech is clear with normal prosody and enunciation. Thought process is linear. Mood is normal and affect is normal.  Cranial nerves II - XII are as described above under HEENT exam. In addition: shoulder shrug is normal with equal shoulder height noted. Motor exam: Normal bulk, strength and tone is noted. There is no drift, tremor. Fine motor skills and coordination: grossly intact.  Cerebellar testing: No dysmetria or intention tremor. There is no truncal or gait ataxia.  Sensory exam: intact to light touch in the upper and lower extremities.  Gait, station and balance: She stands easily. No veering to one side is noted. No leaning to one side is noted. Posture is age-appropriate and stance is narrow based. Gait shows normal stride length and normal pace. No problems turning are noted.   Assessment and Plan:  In summary, ANORAH TRIAS is a very pleasant 74 year old female with an underlying medical history of left knee pain,  hypertension, hyperlipidemia, and morbid obesity with a BMI of over 40, whose history and physical exam are concerning for obstructive sleep apnea (  OSA).  I had a long chat with the patient about my findings and the diagnosis of OSA, its prognosis and treatment options. We talked about medical treatments, surgical interventions and non-pharmacological approaches. I explained in particular the risks and ramifications of untreated moderate to severe OSA, especially with respect to developing cardiovascular disease down the Road, including congestive heart failure, difficult to treat hypertension, cardiac arrhythmias, or stroke. Even type 2 diabetes has, in part, been linked to untreated OSA. Symptoms of untreated OSA include daytime sleepiness, memory problems, mood irritability and mood disorder such as depression and anxiety, lack of energy, as well as recurrent headaches, especially morning headaches. We talked about trying to maintain a healthy lifestyle in general, as well as the importance of weight control. I advised the patient strongly and repeatedly not to drive when feeling sleepy. I recommended the following at this time: sleep study.  I explained the sleep test procedure to the patient and also outlined possible surgical and non-surgical treatment options of OSA, including the use of a custom-made dental device (which would require a referral to a specialist dentist or oral surgeon), upper airway surgical options (which would involve a referral to an ENT surgeon). I also explained the CPAP treatment option to the patient, who indicated that she would be willing to try CPAP if the need arises. I explained the importance of being compliant with PAP treatment, not only for insurance purposes but primarily to improve Her symptoms, and for the patient's long term health benefit, including to reduce Her cardiovascular risks. I answered all her questions today and the patient was in agreement. I would like  to see her back after the sleep study is completed and encouraged her to call with any interim questions, concerns, problems or updates.   Thank you very much for allowing me to participate in the care of this nice patient. If I can be of any further assistance to you please do not hesitate to call me at [phone].  Sincerely,   Huston Foley, MD, PhD

## 2020-01-15 NOTE — Patient Instructions (Signed)
  Based on your symptoms and your exam I believe you are at risk for obstructive sleep apnea (aka OSA), and I think we should proceed with a sleep study to determine whether you do or do not have OSA and how severe it is. Even, if you have mild OSA, I may want you to consider treatment with CPAP, as treatment of even borderline or mild sleep apnea can result and improvement of symptoms such as sleep disruption, daytime sleepiness, nighttime bathroom breaks, restless leg symptoms, improvement of headache syndromes, even improved mood disorder.   Please remember, the long-term risks and ramifications of untreated moderate to severe obstructive sleep apnea are: increased Cardiovascular disease, including congestive heart failure, stroke, difficult to control hypertension, treatment resistant obesity, arrhythmias, especially irregular heartbeat commonly known as A. Fib. (atrial fibrillation); even type 2 diabetes has been linked to untreated OSA.   Sleep apnea can cause disruption of sleep and sleep deprivation in most cases, which, in turn, can cause recurrent headaches, problems with memory, mood, concentration, focus, and vigilance. Most people with untreated sleep apnea report excessive daytime sleepiness, which can affect their ability to drive. Please do not drive if you feel sleepy. Patients with sleep apnea developed difficulty initiating and maintaining sleep (aka insomnia).   Having sleep apnea may increase your risk for other sleep disorders, including involuntary behaviors sleep such as sleep terrors, sleep talking, sleepwalking.    Having sleep apnea can also increase your risk for restless leg syndrome and leg movements at night.   Please note that untreated obstructive sleep apnea may carry additional perioperative morbidity. Patients with significant obstructive sleep apnea (typically, in the moderate to severe degree) should receive, if possible, perioperative PAP (positive airway pressure)  therapy and the surgeons and particularly the anesthesiologists should be informed of the diagnosis and the severity of the sleep disordered breathing.   I will likely see you back after your sleep study to go over the test results and where to go from there. We will call you after your sleep study to advise about the results (most likely, you will hear from Kristen, my nurse) and to set up an appointment at the time, as necessary.    Our sleep lab administrative assistant will call you to schedule your sleep study and give you further instructions, regarding the check in process for the sleep study, arrival time, what to bring, when you can expect to leave after the study, etc., and to answer any other logistical questions you may have. If you don't hear back from her by about 2 weeks from now, please feel free to call her direct line at [phone] or you can call our general clinic number, or email us through My Chart.   

## 2020-01-24 ENCOUNTER — Telehealth: Payer: Self-pay

## 2020-01-24 NOTE — Telephone Encounter (Signed)
LVM for pt to call me back to schedule sleep study  

## 2020-01-29 DIAGNOSIS — I10 Essential (primary) hypertension: Secondary | ICD-10-CM | POA: Diagnosis not present

## 2020-01-29 DIAGNOSIS — M150 Primary generalized (osteo)arthritis: Secondary | ICD-10-CM | POA: Diagnosis not present

## 2020-01-29 DIAGNOSIS — E6609 Other obesity due to excess calories: Secondary | ICD-10-CM | POA: Diagnosis not present

## 2020-02-04 ENCOUNTER — Telehealth: Payer: Self-pay

## 2020-02-04 NOTE — Telephone Encounter (Signed)
LVM for pt to call me back to schedule sleep study  

## 2020-03-25 DIAGNOSIS — M150 Primary generalized (osteo)arthritis: Secondary | ICD-10-CM | POA: Diagnosis not present

## 2020-03-25 DIAGNOSIS — I10 Essential (primary) hypertension: Secondary | ICD-10-CM | POA: Diagnosis not present

## 2020-04-09 DIAGNOSIS — R05 Cough: Secondary | ICD-10-CM | POA: Diagnosis not present

## 2020-04-09 DIAGNOSIS — Z20822 Contact with and (suspected) exposure to covid-19: Secondary | ICD-10-CM | POA: Diagnosis not present

## 2020-04-21 ENCOUNTER — Other Ambulatory Visit: Payer: Self-pay | Admitting: Family Medicine

## 2020-04-21 DIAGNOSIS — Z1231 Encounter for screening mammogram for malignant neoplasm of breast: Secondary | ICD-10-CM

## 2020-05-06 DIAGNOSIS — Z79899 Other long term (current) drug therapy: Secondary | ICD-10-CM | POA: Diagnosis not present

## 2020-05-06 DIAGNOSIS — I10 Essential (primary) hypertension: Secondary | ICD-10-CM | POA: Diagnosis not present

## 2020-05-06 DIAGNOSIS — Z888 Allergy status to other drugs, medicaments and biological substances status: Secondary | ICD-10-CM | POA: Diagnosis not present

## 2020-05-06 DIAGNOSIS — Z87891 Personal history of nicotine dependence: Secondary | ICD-10-CM | POA: Diagnosis not present

## 2020-05-20 ENCOUNTER — Ambulatory Visit: Payer: Medicare HMO

## 2020-07-01 ENCOUNTER — Ambulatory Visit
Admission: RE | Admit: 2020-07-01 | Discharge: 2020-07-01 | Disposition: A | Discharge status: Home / Self Care | Payer: Medicare HMO | Source: Ambulatory Visit | Attending: Family Medicine | Admitting: Family Medicine

## 2020-07-01 ENCOUNTER — Other Ambulatory Visit: Payer: Self-pay

## 2020-07-01 DIAGNOSIS — Z1231 Encounter for screening mammogram for malignant neoplasm of breast: Secondary | ICD-10-CM

## 2020-07-22 DIAGNOSIS — R600 Localized edema: Secondary | ICD-10-CM | POA: Diagnosis not present

## 2020-07-22 DIAGNOSIS — E782 Mixed hyperlipidemia: Secondary | ICD-10-CM | POA: Diagnosis not present

## 2020-07-22 DIAGNOSIS — Z713 Dietary counseling and surveillance: Secondary | ICD-10-CM | POA: Diagnosis not present

## 2020-07-22 DIAGNOSIS — I10 Essential (primary) hypertension: Secondary | ICD-10-CM | POA: Diagnosis not present

## 2020-07-22 DIAGNOSIS — Z87891 Personal history of nicotine dependence: Secondary | ICD-10-CM | POA: Diagnosis not present

## 2020-07-22 DIAGNOSIS — Z79899 Other long term (current) drug therapy: Secondary | ICD-10-CM | POA: Diagnosis not present

## 2020-07-22 DIAGNOSIS — Z7182 Exercise counseling: Secondary | ICD-10-CM | POA: Diagnosis not present

## 2020-11-18 DIAGNOSIS — K5790 Diverticulosis of intestine, part unspecified, without perforation or abscess without bleeding: Secondary | ICD-10-CM | POA: Diagnosis not present

## 2020-11-18 DIAGNOSIS — E785 Hyperlipidemia, unspecified: Secondary | ICD-10-CM | POA: Diagnosis not present

## 2020-11-18 DIAGNOSIS — N281 Cyst of kidney, acquired: Secondary | ICD-10-CM | POA: Diagnosis not present

## 2020-11-18 DIAGNOSIS — Z79899 Other long term (current) drug therapy: Secondary | ICD-10-CM | POA: Diagnosis not present

## 2020-11-18 DIAGNOSIS — R7303 Prediabetes: Secondary | ICD-10-CM | POA: Diagnosis not present

## 2020-11-18 DIAGNOSIS — I10 Essential (primary) hypertension: Secondary | ICD-10-CM | POA: Diagnosis not present

## 2020-11-18 DIAGNOSIS — E559 Vitamin D deficiency, unspecified: Secondary | ICD-10-CM | POA: Diagnosis not present

## 2020-11-18 DIAGNOSIS — I700 Atherosclerosis of aorta: Secondary | ICD-10-CM | POA: Diagnosis not present

## 2021-03-16 ENCOUNTER — Other Ambulatory Visit: Payer: Self-pay | Admitting: Registered Nurse

## 2021-03-16 DIAGNOSIS — E2839 Other primary ovarian failure: Secondary | ICD-10-CM

## 2021-03-17 ENCOUNTER — Other Ambulatory Visit (HOSPITAL_COMMUNITY): Payer: Self-pay | Admitting: Registered Nurse

## 2021-03-17 DIAGNOSIS — I10 Essential (primary) hypertension: Secondary | ICD-10-CM

## 2021-03-17 DIAGNOSIS — I700 Atherosclerosis of aorta: Secondary | ICD-10-CM

## 2021-03-25 ENCOUNTER — Other Ambulatory Visit: Payer: Self-pay | Admitting: Registered Nurse

## 2021-03-25 DIAGNOSIS — E2839 Other primary ovarian failure: Secondary | ICD-10-CM

## 2021-03-31 ENCOUNTER — Ambulatory Visit
Admission: RE | Admit: 2021-03-31 | Discharge: 2021-03-31 | Disposition: A | Discharge status: Home / Self Care | Payer: Medicare HMO | Source: Ambulatory Visit | Attending: Registered Nurse | Admitting: Registered Nurse

## 2021-03-31 ENCOUNTER — Other Ambulatory Visit: Payer: Self-pay

## 2021-03-31 DIAGNOSIS — E2839 Other primary ovarian failure: Secondary | ICD-10-CM

## 2021-04-07 ENCOUNTER — Other Ambulatory Visit: Payer: Self-pay

## 2021-04-07 ENCOUNTER — Ambulatory Visit (HOSPITAL_COMMUNITY): Payer: Medicare HMO | Attending: Internal Medicine

## 2021-04-07 DIAGNOSIS — I10 Essential (primary) hypertension: Secondary | ICD-10-CM | POA: Diagnosis present

## 2021-04-07 DIAGNOSIS — I700 Atherosclerosis of aorta: Secondary | ICD-10-CM | POA: Insufficient documentation

## 2021-04-07 LAB — ECHOCARDIOGRAM COMPLETE
Area-P 1/2: 3.5 cm2
P 1/2 time: 512 msec
S' Lateral: 3.3 cm

## 2021-06-01 ENCOUNTER — Other Ambulatory Visit: Payer: Self-pay | Admitting: Registered Nurse

## 2021-06-01 DIAGNOSIS — Z1231 Encounter for screening mammogram for malignant neoplasm of breast: Secondary | ICD-10-CM

## 2021-07-08 ENCOUNTER — Ambulatory Visit
Admission: RE | Admit: 2021-07-08 | Discharge: 2021-07-08 | Disposition: A | Discharge status: Home / Self Care | Payer: Medicare HMO | Source: Ambulatory Visit | Attending: Registered Nurse | Admitting: Registered Nurse

## 2021-07-08 DIAGNOSIS — Z1231 Encounter for screening mammogram for malignant neoplasm of breast: Secondary | ICD-10-CM

## 2022-06-07 ENCOUNTER — Other Ambulatory Visit: Payer: Self-pay | Admitting: Registered Nurse

## 2022-06-07 DIAGNOSIS — Z1231 Encounter for screening mammogram for malignant neoplasm of breast: Secondary | ICD-10-CM

## 2022-07-27 ENCOUNTER — Ambulatory Visit: Payer: Medicare HMO

## 2022-08-06 ENCOUNTER — Ambulatory Visit: Payer: Medicare HMO

## 2022-09-21 ENCOUNTER — Other Ambulatory Visit: Payer: Self-pay | Admitting: Internal Medicine

## 2022-09-22 LAB — COMPLETE METABOLIC PANEL WITH GFR
AG Ratio: 1.3 (calc) (ref 1.0–2.5)
ALT: 15 U/L (ref 6–29)
AST: 18 U/L (ref 10–35)
Albumin: 4.4 g/dL (ref 3.6–5.1)
Alkaline phosphatase (APISO): 76 U/L (ref 37–153)
BUN/Creatinine Ratio: 16 (calc) (ref 6–22)
BUN: 16 mg/dL (ref 7–25)
CO2: 27 mmol/L (ref 20–32)
Calcium: 9.7 mg/dL (ref 8.6–10.4)
Chloride: 102 mmol/L (ref 98–110)
Creat: 1.01 mg/dL — ABNORMAL HIGH (ref 0.60–1.00)
Globulin: 3.3 g/dL (calc) (ref 1.9–3.7)
Glucose, Bld: 80 mg/dL (ref 65–99)
Potassium: 4.6 mmol/L (ref 3.5–5.3)
Sodium: 140 mmol/L (ref 135–146)
Total Bilirubin: 0.4 mg/dL (ref 0.2–1.2)
Total Protein: 7.7 g/dL (ref 6.1–8.1)
eGFR: 58 mL/min/{1.73_m2} — ABNORMAL LOW (ref >=60)

## 2022-09-22 LAB — CBC
HCT: 38.1 % (ref 35.0–45.0)
Hemoglobin: 12.2 g/dL (ref 11.7–15.5)
MCH: 27.9 pg (ref 27.0–33.0)
MCHC: 32 g/dL (ref 32.0–36.0)
MCV: 87.2 fL (ref 80.0–100.0)
MPV: 11.9 fL (ref 7.5–12.5)
Platelets: 288 10*3/uL (ref 140–400)
RBC: 4.37 10*6/uL (ref 3.80–5.10)
RDW: 13.5 % (ref 11.0–15.0)
WBC: 4.2 10*3/uL (ref 3.8–10.8)

## 2022-09-22 LAB — LIPID PANEL
Cholesterol: 314 mg/dL — ABNORMAL HIGH (ref <200)
HDL: 63 mg/dL (ref >=50)
LDL Cholesterol (Calc): 225 mg/dL (calc) — ABNORMAL HIGH
Non-HDL Cholesterol (Calc): 251 mg/dL (calc) — ABNORMAL HIGH (ref <130)
Total CHOL/HDL Ratio: 5 (calc) — ABNORMAL HIGH (ref <5.0)
Triglycerides: 119 mg/dL (ref <150)

## 2022-09-22 LAB — TSH: TSH: 1.27 mIU/L (ref 0.40–4.50)

## 2022-11-10 ENCOUNTER — Ambulatory Visit
Admission: RE | Admit: 2022-11-10 | Discharge: 2022-11-10 | Disposition: A | Discharge status: Home / Self Care | Payer: Medicare Other | Source: Ambulatory Visit | Attending: Registered Nurse | Admitting: Registered Nurse

## 2022-11-10 DIAGNOSIS — Z1231 Encounter for screening mammogram for malignant neoplasm of breast: Secondary | ICD-10-CM

## 2022-12-01 ENCOUNTER — Ambulatory Visit: Payer: Medicare HMO | Admitting: Internal Medicine

## 2023-07-01 ENCOUNTER — Other Ambulatory Visit: Payer: Self-pay | Admitting: Internal Medicine

## 2023-07-01 DIAGNOSIS — Z1231 Encounter for screening mammogram for malignant neoplasm of breast: Secondary | ICD-10-CM

## 2023-11-11 ENCOUNTER — Ambulatory Visit
Admission: RE | Admit: 2023-11-11 | Discharge: 2023-11-11 | Disposition: A | Discharge status: Home / Self Care | Payer: Medicare Other | Source: Ambulatory Visit | Attending: Internal Medicine | Admitting: Internal Medicine

## 2023-11-11 DIAGNOSIS — Z1231 Encounter for screening mammogram for malignant neoplasm of breast: Secondary | ICD-10-CM

## 2024-02-28 DIAGNOSIS — E782 Mixed hyperlipidemia: Secondary | ICD-10-CM | POA: Diagnosis not present

## 2024-02-28 DIAGNOSIS — I10 Essential (primary) hypertension: Secondary | ICD-10-CM | POA: Diagnosis not present

## 2024-02-28 DIAGNOSIS — N182 Chronic kidney disease, stage 2 (mild): Secondary | ICD-10-CM | POA: Diagnosis not present

## 2024-02-28 DIAGNOSIS — R300 Dysuria: Secondary | ICD-10-CM | POA: Diagnosis not present

## 2024-03-13 DIAGNOSIS — H43393 Other vitreous opacities, bilateral: Secondary | ICD-10-CM | POA: Diagnosis not present

## 2024-04-03 DIAGNOSIS — N182 Chronic kidney disease, stage 2 (mild): Secondary | ICD-10-CM | POA: Diagnosis not present

## 2024-04-03 DIAGNOSIS — Z0000 Encounter for general adult medical examination without abnormal findings: Secondary | ICD-10-CM | POA: Diagnosis not present

## 2024-04-03 DIAGNOSIS — E782 Mixed hyperlipidemia: Secondary | ICD-10-CM | POA: Diagnosis not present

## 2024-04-03 DIAGNOSIS — I10 Essential (primary) hypertension: Secondary | ICD-10-CM | POA: Diagnosis not present

## 2024-04-03 DIAGNOSIS — Z23 Encounter for immunization: Secondary | ICD-10-CM | POA: Diagnosis not present

## 2024-04-03 DIAGNOSIS — R300 Dysuria: Secondary | ICD-10-CM | POA: Diagnosis not present

## 2024-04-06 DIAGNOSIS — N182 Chronic kidney disease, stage 2 (mild): Secondary | ICD-10-CM | POA: Diagnosis not present

## 2024-04-06 DIAGNOSIS — E782 Mixed hyperlipidemia: Secondary | ICD-10-CM | POA: Diagnosis not present

## 2024-04-06 DIAGNOSIS — R300 Dysuria: Secondary | ICD-10-CM | POA: Diagnosis not present

## 2024-04-06 DIAGNOSIS — I10 Essential (primary) hypertension: Secondary | ICD-10-CM | POA: Diagnosis not present

## 2024-04-12 DIAGNOSIS — Z23 Encounter for immunization: Secondary | ICD-10-CM | POA: Diagnosis not present

## 2024-04-12 DIAGNOSIS — N182 Chronic kidney disease, stage 2 (mild): Secondary | ICD-10-CM | POA: Diagnosis not present

## 2024-04-12 DIAGNOSIS — E782 Mixed hyperlipidemia: Secondary | ICD-10-CM | POA: Diagnosis not present

## 2024-04-12 DIAGNOSIS — T466X5A Adverse effect of antihyperlipidemic and antiarteriosclerotic drugs, initial encounter: Secondary | ICD-10-CM | POA: Diagnosis not present

## 2024-04-12 DIAGNOSIS — I10 Essential (primary) hypertension: Secondary | ICD-10-CM | POA: Diagnosis not present

## 2024-04-12 DIAGNOSIS — G720 Drug-induced myopathy: Secondary | ICD-10-CM | POA: Diagnosis not present

## 2024-04-12 DIAGNOSIS — Z0000 Encounter for general adult medical examination without abnormal findings: Secondary | ICD-10-CM | POA: Diagnosis not present
